# Patient Record
Sex: Female | Born: 1945 | Race: White | Hispanic: No | Smoking: Former smoker
Health system: Southern US, Community
[De-identification: ages and names within clinical notes are randomized; demographics above are authoritative.]

## PROBLEM LIST (undated history)

## (undated) DIAGNOSIS — E7800 Pure hypercholesterolemia, unspecified: Secondary | ICD-10-CM

## (undated) DIAGNOSIS — Z9889 Other specified postprocedural states: Secondary | ICD-10-CM

## (undated) DIAGNOSIS — F329 Major depressive disorder, single episode, unspecified: Secondary | ICD-10-CM

## (undated) DIAGNOSIS — E079 Disorder of thyroid, unspecified: Secondary | ICD-10-CM

## (undated) DIAGNOSIS — E039 Hypothyroidism, unspecified: Secondary | ICD-10-CM

## (undated) DIAGNOSIS — M5020 Other cervical disc displacement, unspecified cervical region: Secondary | ICD-10-CM

## (undated) DIAGNOSIS — E049 Nontoxic goiter, unspecified: Secondary | ICD-10-CM

## (undated) DIAGNOSIS — D649 Anemia, unspecified: Secondary | ICD-10-CM

## (undated) DIAGNOSIS — I10 Essential (primary) hypertension: Secondary | ICD-10-CM

## (undated) DIAGNOSIS — M810 Age-related osteoporosis without current pathological fracture: Secondary | ICD-10-CM

## (undated) DIAGNOSIS — H269 Unspecified cataract: Secondary | ICD-10-CM

## (undated) DIAGNOSIS — I509 Heart failure, unspecified: Secondary | ICD-10-CM

## (undated) DIAGNOSIS — Z87891 Personal history of nicotine dependence: Secondary | ICD-10-CM

## (undated) DIAGNOSIS — I499 Cardiac arrhythmia, unspecified: Secondary | ICD-10-CM

## (undated) DIAGNOSIS — R112 Nausea with vomiting, unspecified: Secondary | ICD-10-CM

## (undated) DIAGNOSIS — F32A Depression, unspecified: Secondary | ICD-10-CM

## (undated) DIAGNOSIS — J449 Chronic obstructive pulmonary disease, unspecified: Secondary | ICD-10-CM

## (undated) DIAGNOSIS — M1990 Unspecified osteoarthritis, unspecified site: Secondary | ICD-10-CM

## (undated) DIAGNOSIS — F419 Anxiety disorder, unspecified: Secondary | ICD-10-CM

## (undated) HISTORY — DX: Personal history of nicotine dependence: Z87.891

## (undated) HISTORY — DX: Heart failure, unspecified: I50.9

## (undated) HISTORY — DX: Cardiac arrhythmia, unspecified: I49.9

## (undated) HISTORY — PX: ABDOMINAL HYSTERECTOMY: SHX81

## (undated) HISTORY — PX: COLONOSCOPY: SHX174

## (undated) HISTORY — PX: TUBAL LIGATION: SHX77

---

## 1993-09-25 HISTORY — PX: AUGMENTATION MAMMAPLASTY: SUR837

## 2004-07-26 ENCOUNTER — Ambulatory Visit: Payer: Self-pay | Admitting: Unknown Physician Specialty

## 2005-07-27 ENCOUNTER — Ambulatory Visit: Payer: Self-pay | Admitting: Unknown Physician Specialty

## 2006-04-27 ENCOUNTER — Ambulatory Visit: Payer: Self-pay | Admitting: Gastroenterology

## 2008-03-20 ENCOUNTER — Ambulatory Visit: Payer: Self-pay | Admitting: Unknown Physician Specialty

## 2008-08-10 ENCOUNTER — Emergency Department: Payer: Self-pay | Admitting: Internal Medicine

## 2008-08-27 ENCOUNTER — Ambulatory Visit: Payer: Self-pay | Admitting: Internal Medicine

## 2013-10-23 ENCOUNTER — Ambulatory Visit: Payer: Self-pay | Admitting: Internal Medicine

## 2014-10-28 ENCOUNTER — Ambulatory Visit: Payer: Self-pay | Admitting: Family Medicine

## 2015-02-23 ENCOUNTER — Ambulatory Visit: Payer: Medicare Other

## 2015-02-23 ENCOUNTER — Ambulatory Visit
Admission: EM | Admit: 2015-02-23 | Discharge: 2015-02-23 | Disposition: A | Discharge status: Home / Self Care | Payer: Medicare Other | Attending: Family Medicine | Admitting: Family Medicine

## 2015-02-23 DIAGNOSIS — Z87891 Personal history of nicotine dependence: Secondary | ICD-10-CM | POA: Diagnosis not present

## 2015-02-23 DIAGNOSIS — E079 Disorder of thyroid, unspecified: Secondary | ICD-10-CM | POA: Diagnosis not present

## 2015-02-23 DIAGNOSIS — I10 Essential (primary) hypertension: Secondary | ICD-10-CM | POA: Diagnosis not present

## 2015-02-23 DIAGNOSIS — M7712 Lateral epicondylitis, left elbow: Secondary | ICD-10-CM

## 2015-02-23 DIAGNOSIS — M810 Age-related osteoporosis without current pathological fracture: Secondary | ICD-10-CM | POA: Insufficient documentation

## 2015-02-23 DIAGNOSIS — E780 Pure hypercholesterolemia: Secondary | ICD-10-CM | POA: Insufficient documentation

## 2015-02-23 DIAGNOSIS — Z79899 Other long term (current) drug therapy: Secondary | ICD-10-CM | POA: Diagnosis not present

## 2015-02-23 DIAGNOSIS — M25532 Pain in left wrist: Secondary | ICD-10-CM | POA: Diagnosis present

## 2015-02-23 HISTORY — DX: Disorder of thyroid, unspecified: E07.9

## 2015-02-23 HISTORY — DX: Pure hypercholesterolemia, unspecified: E78.00

## 2015-02-23 HISTORY — DX: Age-related osteoporosis without current pathological fracture: M81.0

## 2015-02-23 HISTORY — DX: Essential (primary) hypertension: I10
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
## 2015-02-23 NOTE — Discharge Instructions (Signed)
capscaicin cream Lateral Epicondylitis (Tennis Elbow) with Rehab Lateral epicondylitis involves inflammation and pain around the outer portion of the elbow. The pain is caused by inflammation of the tendons in the forearm that bring back (extend) the wrist. Lateral epicondylitis is also called tennis elbow, because it is very common in tennis players. However, it may occur in any individual who extends the wrist repetitively. If lateral epicondylitis is left untreated, it may become a chronic problem. SYMPTOMS   Pain, tenderness, and inflammation on the outer (lateral) side of the elbow.  Pain or weakness with gripping activities.  Pain that increases with wrist-twisting motions (playing tennis, using a screwdriver, opening a door or a jar).  Pain with lifting objects, including a coffee cup. CAUSES  Lateral epicondylitis is caused by inflammation of the tendons that extend the wrist. Causes of injury may include:  Repetitive stress and strain on the muscles and tendons that extend the wrist.  Sudden change in activity level or intensity.  Incorrect grip in racquet sports.  Incorrect grip size of racquet (often too large).  Incorrect hitting position or technique (usually backhand, leading with the elbow).  Using a racket that is too heavy. RISK INCREASES WITH:  Sports or occupations that require repetitive and/or strenuous forearm and wrist movements (tennis, squash, racquetball, carpentry).  Poor wrist and forearm strength and flexibility.  Failure to warm up properly before activity.  Resuming activity before healing, rehabilitation, and conditioning are complete. PREVENTION   Warm up and stretch properly before activity.  Maintain physical fitness:  Strength, flexibility, and endurance.  Cardiovascular fitness.  Wear and use properly fitted equipment.  Learn and use proper technique and have a coach correct improper technique.  Wear a tennis elbow (counterforce)  brace. PROGNOSIS  The course of this condition depends on the degree of the injury. If treated properly, acute cases (symptoms lasting less than 4 weeks) are often resolved in 2 to 6 weeks. Chronic (longer lasting cases) often resolve in 3 to 6 months but may require physical therapy. RELATED COMPLICATIONS   Frequently recurring symptoms, resulting in a chronic problem. Properly treating the problem the first time decreases frequency of recurrence.  Chronic inflammation, scarring tendon degeneration, and partial tendon tear, requiring surgery.  Delayed healing or resolution of symptoms. TREATMENT  Treatment first involves the use of ice and medicine to reduce pain and inflammation. Strengthening and stretching exercises may help reduce discomfort if performed regularly. These exercises may be performed at home if the condition is an acute injury. Chronic cases may require a referral to a physical therapist for evaluation and treatment. Your caregiver may advise a corticosteroid injection to help reduce inflammation. Rarely, surgery is needed. MEDICATION  If pain medicine is needed, nonsteroidal anti-inflammatory medicines (aspirin and ibuprofen), or other minor pain relievers (acetaminophen), are often advised.  Do not take pain medicine for 7 days before surgery.  Prescription pain relievers may be given, if your caregiver thinks they are needed. Use only as directed and only as much as you need.  Corticosteroid injections may be recommended. These injections should be reserved only for the most severe cases, because they can only be given a certain number of times. HEAT AND COLD  Cold treatment (icing) should be applied for 10 to 15 minutes every 2 to 3 hours for inflammation and pain, and immediately after activity that aggravates your symptoms. Use ice packs or an ice massage.  Heat treatment may be used before performing stretching and strengthening activities prescribed by  your  caregiver, physical therapist, or athletic trainer. Use a heat pack or a warm water soak. SEEK MEDICAL CARE IF: Symptoms get worse or do not improve in 2 weeks, despite treatment. EXERCISES  RANGE OF MOTION (ROM) AND STRETCHING EXERCISES - Epicondylitis, Lateral (Tennis Elbow) These exercises may help you when beginning to rehabilitate your injury. Your symptoms may go away with or without further involvement from your physician, physical therapist, or athletic trainer. While completing these exercises, remember:   Restoring tissue flexibility helps normal motion to return to the joints. This allows healthier, less painful movement and activity.  An effective stretch should be held for at least 30 seconds.  A stretch should never be painful. You should only feel a gentle lengthening or release in the stretched tissue. RANGE OF MOTION - Wrist Flexion, Active-Assisted  Extend your right / left elbow with your fingers pointing down.*  Gently pull the back of your hand towards you, until you feel a gentle stretch on the top of your forearm.  Hold this position for __________ seconds. Repeat __________ times. Complete this exercise __________ times per day.  *If directed by your physician, physical therapist or athletic trainer, complete this stretch with your elbow bent, rather than extended. RANGE OF MOTION - Wrist Extension, Active-Assisted  Extend your right / left elbow and turn your palm upwards.*  Gently pull your palm and fingertips back, so your wrist extends and your fingers point more toward the ground.  You should feel a gentle stretch on the inside of your forearm.  Hold this position for __________ seconds. Repeat __________ times. Complete this exercise __________ times per day. *If directed by your physician, physical therapist or athletic trainer, complete this stretch with your elbow bent, rather than extended. STRETCH - Wrist Flexion  Place the back of your right /  left hand on a tabletop, leaving your elbow slightly bent. Your fingers should point away from your body.  Gently press the back of your hand down onto the table by straightening your elbow. You should feel a stretch on the top of your forearm.  Hold this position for __________ seconds. Repeat __________ times. Complete this stretch __________ times per day.  STRETCH - Wrist Extension   Place your right / left fingertips on a tabletop, leaving your elbow slightly bent. Your fingers should point backwards.  Gently press your fingers and palm down onto the table by straightening your elbow. You should feel a stretch on the inside of your forearm.  Hold this position for __________ seconds. Repeat __________ times. Complete this stretch __________ times per day.  STRENGTHENING EXERCISES - Epicondylitis, Lateral (Tennis Elbow) These exercises may help you when beginning to rehabilitate your injury. They may resolve your symptoms with or without further involvement from your physician, physical therapist, or athletic trainer. While completing these exercises, remember:   Muscles can gain both the endurance and the strength needed for everyday activities through controlled exercises.  Complete these exercises as instructed by your physician, physical therapist or athletic trainer. Increase the resistance and repetitions only as guided.  You may experience muscle soreness or fatigue, but the pain or discomfort you are trying to eliminate should never worsen during these exercises. If this pain does get worse, stop and make sure you are following the directions exactly. If the pain is still present after adjustments, discontinue the exercise until you can discuss the trouble with your caregiver. STRENGTH - Wrist Flexors  Sit with your right / left forearm  palm-up and fully supported on a table or countertop. Your elbow should be resting below the height of your shoulder. Allow your wrist to extend  over the edge of the surface.  Loosely holding a __________ weight, or a piece of rubber exercise band or tubing, slowly curl your hand up toward your forearm.  Hold this position for __________ seconds. Slowly lower the wrist back to the starting position in a controlled manner. Repeat __________ times. Complete this exercise __________ times per day.  STRENGTH - Wrist Extensors  Sit with your right / left forearm palm-down and fully supported on a table or countertop. Your elbow should be resting below the height of your shoulder. Allow your wrist to extend over the edge of the surface.  Loosely holding a __________ weight, or a piece of rubber exercise band or tubing, slowly curl your hand up toward your forearm.  Hold this position for __________ seconds. Slowly lower the wrist back to the starting position in a controlled manner. Repeat __________ times. Complete this exercise __________ times per day.  STRENGTH - Ulnar Deviators  Stand with a ____________________ weight in your right / left hand, or sit while holding a rubber exercise band or tubing, with your healthy arm supported on a table or countertop.  Move your wrist, so that your pinkie travels toward your forearm and your thumb moves away from your forearm.  Hold this position for __________ seconds and then slowly lower the wrist back to the starting position. Repeat __________ times. Complete this exercise __________ times per day STRENGTH - Radial Deviators  Stand with a ____________________ weight in your right / left hand, or sit while holding a rubber exercise band or tubing, with your injured arm supported on a table or countertop.  Raise your hand upward in front of you or pull up on the rubber tubing.  Hold this position for __________ seconds and then slowly lower the wrist back to the starting position. Repeat __________ times. Complete this exercise __________ times per day. STRENGTH - Forearm Supinators    Sit with your right / left forearm supported on a table, keeping your elbow below shoulder height. Rest your hand over the edge, palm down.  Gently grip a hammer or a soup ladle.  Without moving your elbow, slowly turn your palm and hand upward to a "thumbs-up" position.  Hold this position for __________ seconds. Slowly return to the starting position. Repeat __________ times. Complete this exercise __________ times per day.  STRENGTH - Forearm Pronators   Sit with your right / left forearm supported on a table, keeping your elbow below shoulder height. Rest your hand over the edge, palm up.  Gently grip a hammer or a soup ladle.  Without moving your elbow, slowly turn your palm and hand upward to a "thumbs-up" position.  Hold this position for __________ seconds. Slowly return to the starting position. Repeat __________ times. Complete this exercise __________ times per day.  STRENGTH - Grip  Grasp a tennis ball, a dense sponge, or a large, rolled sock in your hand.  Squeeze as hard as you can, without increasing any pain.  Hold this position for __________ seconds. Release your grip slowly. Repeat __________ times. Complete this exercise __________ times per day.  STRENGTH - Elbow Extensors, Isometric  Stand or sit upright, on a firm surface. Place your right / left arm so that your palm faces your stomach, and it is at the height of your waist.  Place your opposite  hand on the underside of your forearm. Gently push up as your right / left arm resists. Push as hard as you can with both arms, without causing any pain or movement at your right / left elbow. Hold this stationary position for __________ seconds. Gradually release the tension in both arms. Allow your muscles to relax completely before repeating. Document Released: 09/11/2005 Document Revised: 01/26/2014 Document Reviewed: 12/24/2008 [facility] Patient Information 2015 [place], [place]. This information is not intended  to replace advice given to you by your health care provider. Make sure you discuss any questions you have with your health care provider.  Lateral Epicondylitis (Tennis Elbow) with Rehab Lateral epicondylitis involves inflammation and pain around the outer portion of the elbow. The pain is caused by inflammation of the tendons in the forearm that bring back (extend) the wrist. Lateral epicondylitis is also called tennis elbow, because it is very common in tennis players. However, it may occur in any individual who extends the wrist repetitively. If lateral epicondylitis is left untreated, it may become a chronic problem. SYMPTOMS   Pain, tenderness, and inflammation on the outer (lateral) side of the elbow.  Pain or weakness with gripping activities.  Pain that increases with wrist-twisting motions (playing tennis, using a screwdriver, opening a door or a jar).  Pain with lifting objects, including a coffee cup. CAUSES  Lateral epicondylitis is caused by inflammation of the tendons that extend the wrist. Causes of injury may include:  Repetitive stress and strain on the muscles and tendons that extend the wrist.  Sudden change in activity level or intensity.  Incorrect grip in racquet sports.  Incorrect grip size of racquet (often too large).  Incorrect hitting position or technique (usually backhand, leading with the elbow).  Using a racket that is too heavy. RISK INCREASES WITH:  Sports or occupations that require repetitive and/or strenuous forearm and wrist movements (tennis, squash, racquetball, carpentry).  Poor wrist and forearm strength and flexibility.  Failure to warm up properly before activity.  Resuming activity before healing, rehabilitation, and conditioning are complete. PREVENTION   Warm up and stretch properly before activity.  Maintain physical fitness:  Strength, flexibility, and endurance.  Cardiovascular fitness.  Wear and use properly fitted  equipment.  Learn and use proper technique and have a coach correct improper technique.  Wear a tennis elbow (counterforce) brace. PROGNOSIS  The course of this condition depends on the degree of the injury. If treated properly, acute cases (symptoms lasting less than 4 weeks) are often resolved in 2 to 6 weeks. Chronic (longer lasting cases) often resolve in 3 to 6 months but may require physical therapy. RELATED COMPLICATIONS   Frequently recurring symptoms, resulting in a chronic problem. Properly treating the problem the first time decreases frequency of recurrence.  Chronic inflammation, scarring tendon degeneration, and partial tendon tear, requiring surgery.  Delayed healing or resolution of symptoms. TREATMENT  Treatment first involves the use of ice and medicine to reduce pain and inflammation. Strengthening and stretching exercises may help reduce discomfort if performed regularly. These exercises may be performed at home if the condition is an acute injury. Chronic cases may require a referral to a physical therapist for evaluation and treatment. Your caregiver may advise a corticosteroid injection to help reduce inflammation. Rarely, surgery is needed. MEDICATION  If pain medicine is needed, nonsteroidal anti-inflammatory medicines (aspirin and ibuprofen), or other minor pain relievers (acetaminophen), are often advised.  Do not take pain medicine for 7 days before surgery.  Prescription  pain relievers may be given, if your caregiver thinks they are needed. Use only as directed and only as much as you need.  Corticosteroid injections may be recommended. These injections should be reserved only for the most severe cases, because they can only be given a certain number of times. HEAT AND COLD  Cold treatment (icing) should be applied for 10 to 15 minutes every 2 to 3 hours for inflammation and pain, and immediately after activity that aggravates your symptoms. Use ice packs or an  ice massage.  Heat treatment may be used before performing stretching and strengthening activities prescribed by your caregiver, physical therapist, or athletic trainer. Use a heat pack or a warm water soak. SEEK MEDICAL CARE IF: Symptoms get worse or do not improve in 2 weeks, despite treatment. EXERCISES  RANGE OF MOTION (ROM) AND STRETCHING EXERCISES - Epicondylitis, Lateral (Tennis Elbow) These exercises may help you when beginning to rehabilitate your injury. Your symptoms may go away with or without further involvement from your physician, physical therapist, or athletic trainer. While completing these exercises, remember:   Restoring tissue flexibility helps normal motion to return to the joints. This allows healthier, less painful movement and activity.  An effective stretch should be held for at least 30 seconds.  A stretch should never be painful. You should only feel a gentle lengthening or release in the stretched tissue. RANGE OF MOTION - Wrist Flexion, Active-Assisted  Extend your right / left elbow with your fingers pointing down.*  Gently pull the back of your hand towards you, until you feel a gentle stretch on the top of your forearm.  Hold this position for __________ seconds. Repeat __________ times. Complete this exercise __________ times per day.  *If directed by your physician, physical therapist or athletic trainer, complete this stretch with your elbow bent, rather than extended. RANGE OF MOTION - Wrist Extension, Active-Assisted  Extend your right / left elbow and turn your palm upwards.*  Gently pull your palm and fingertips back, so your wrist extends and your fingers point more toward the ground.  You should feel a gentle stretch on the inside of your forearm.  Hold this position for __________ seconds. Repeat __________ times. Complete this exercise __________ times per day. *If directed by your physician, physical therapist or athletic trainer, complete  this stretch with your elbow bent, rather than extended. STRETCH - Wrist Flexion  Place the back of your right / left hand on a tabletop, leaving your elbow slightly bent. Your fingers should point away from your body.  Gently press the back of your hand down onto the table by straightening your elbow. You should feel a stretch on the top of your forearm.  Hold this position for __________ seconds. Repeat __________ times. Complete this stretch __________ times per day.  STRETCH - Wrist Extension   Place your right / left fingertips on a tabletop, leaving your elbow slightly bent. Your fingers should point backwards.  Gently press your fingers and palm down onto the table by straightening your elbow. You should feel a stretch on the inside of your forearm.  Hold this position for __________ seconds. Repeat __________ times. Complete this stretch __________ times per day.  STRENGTHENING EXERCISES - Epicondylitis, Lateral (Tennis Elbow) These exercises may help you when beginning to rehabilitate your injury. They may resolve your symptoms with or without further involvement from your physician, physical therapist, or athletic trainer. While completing these exercises, remember:   Muscles can gain both the endurance  and the strength needed for everyday activities through controlled exercises.  Complete these exercises as instructed by your physician, physical therapist or athletic trainer. Increase the resistance and repetitions only as guided.  You may experience muscle soreness or fatigue, but the pain or discomfort you are trying to eliminate should never worsen during these exercises. If this pain does get worse, stop and make sure you are following the directions exactly. If the pain is still present after adjustments, discontinue the exercise until you can discuss the trouble with your caregiver. STRENGTH - Wrist Flexors  Sit with your right / left forearm palm-up and fully supported on  a table or countertop. Your elbow should be resting below the height of your shoulder. Allow your wrist to extend over the edge of the surface.  Loosely holding a __________ weight, or a piece of rubber exercise band or tubing, slowly curl your hand up toward your forearm.  Hold this position for __________ seconds. Slowly lower the wrist back to the starting position in a controlled manner. Repeat __________ times. Complete this exercise __________ times per day.  STRENGTH - Wrist Extensors  Sit with your right / left forearm palm-down and fully supported on a table or countertop. Your elbow should be resting below the height of your shoulder. Allow your wrist to extend over the edge of the surface.  Loosely holding a __________ weight, or a piece of rubber exercise band or tubing, slowly curl your hand up toward your forearm.  Hold this position for __________ seconds. Slowly lower the wrist back to the starting position in a controlled manner. Repeat __________ times. Complete this exercise __________ times per day.  STRENGTH - Ulnar Deviators  Stand with a ____________________ weight in your right / left hand, or sit while holding a rubber exercise band or tubing, with your healthy arm supported on a table or countertop.  Move your wrist, so that your pinkie travels toward your forearm and your thumb moves away from your forearm.  Hold this position for __________ seconds and then slowly lower the wrist back to the starting position. Repeat __________ times. Complete this exercise __________ times per day STRENGTH - Radial Deviators  Stand with a ____________________ weight in your right / left hand, or sit while holding a rubber exercise band or tubing, with your injured arm supported on a table or countertop.  Raise your hand upward in front of you or pull up on the rubber tubing.  Hold this position for __________ seconds and then slowly lower the wrist back to the starting  position. Repeat __________ times. Complete this exercise __________ times per day. STRENGTH - Forearm Supinators   Sit with your right / left forearm supported on a table, keeping your elbow below shoulder height. Rest your hand over the edge, palm down.  Gently grip a hammer or a soup ladle.  Without moving your elbow, slowly turn your palm and hand upward to a "thumbs-up" position.  Hold this position for __________ seconds. Slowly return to the starting position. Repeat __________ times. Complete this exercise __________ times per day.  STRENGTH - Forearm Pronators   Sit with your right / left forearm supported on a table, keeping your elbow below shoulder height. Rest your hand over the edge, palm up.  Gently grip a hammer or a soup ladle.  Without moving your elbow, slowly turn your palm and hand upward to a "thumbs-up" position.  Hold this position for __________ seconds. Slowly return to the starting  position. Repeat __________ times. Complete this exercise __________ times per day.  STRENGTH - Grip  Grasp a tennis ball, a dense sponge, or a large, rolled sock in your hand.  Squeeze as hard as you can, without increasing any pain.  Hold this position for __________ seconds. Release your grip slowly. Repeat __________ times. Complete this exercise __________ times per day.  STRENGTH - Elbow Extensors, Isometric  Stand or sit upright, on a firm surface. Place your right / left arm so that your palm faces your stomach, and it is at the height of your waist.  Place your opposite hand on the underside of your forearm. Gently push up as your right / left arm resists. Push as hard as you can with both arms, without causing any pain or movement at your right / left elbow. Hold this stationary position for __________ seconds. Gradually release the tension in both arms. Allow your muscles to relax completely before repeating. Document Released: 09/11/2005 Document Revised:  01/26/2014 Document Reviewed: 12/24/2008 [facility] Patient Information 2015 [place], [place]. This information is not intended to replace advice given to you by your health care provider. Make sure you discuss any questions you have with your health care provider.

## 2015-04-16 ENCOUNTER — Other Ambulatory Visit: Payer: Self-pay | Admitting: Internal Medicine

## 2015-04-16 DIAGNOSIS — Z1231 Encounter for screening mammogram for malignant neoplasm of breast: Secondary | ICD-10-CM

## 2015-04-21 ENCOUNTER — Ambulatory Visit
Admission: RE | Admit: 2015-04-21 | Discharge: 2015-04-21 | Disposition: A | Discharge status: Home / Self Care | Payer: Medicare Other | Source: Ambulatory Visit | Attending: Internal Medicine | Admitting: Internal Medicine

## 2015-04-21 ENCOUNTER — Other Ambulatory Visit: Payer: Self-pay | Admitting: Internal Medicine

## 2015-04-21 DIAGNOSIS — Z1231 Encounter for screening mammogram for malignant neoplasm of breast: Secondary | ICD-10-CM | POA: Insufficient documentation

## 2015-12-02 ENCOUNTER — Ambulatory Visit: Payer: Medicare Other | Attending: Rheumatology | Admitting: Occupational Therapy

## 2015-12-02 DIAGNOSIS — M79644 Pain in right finger(s): Secondary | ICD-10-CM

## 2015-12-02 DIAGNOSIS — M6281 Muscle weakness (generalized): Secondary | ICD-10-CM | POA: Diagnosis present

## 2015-12-02 DIAGNOSIS — M25641 Stiffness of right hand, not elsewhere classified: Secondary | ICD-10-CM | POA: Diagnosis present

## 2015-12-02 NOTE — Patient Instructions (Signed)
Pt to do contrast bath  Compression sleeves fitted on 2nd and 3rd digit - large to wear gradaully during day - check for if to tight  If good and wear at night time   Medial N glide for R hand review only 3-5 reps 2 x day   Ed on joint protection principles and Adaptive Equipment for bilateral hands - R more than L  Hand out provided

## 2015-12-02 NOTE — Therapy (Signed)
[facility] PHYSICAL AND SPORTS MEDICINE [address] [place], [place], [address] Phone: [phone]   Fax:  [phone]  Occupational Therapy Treatment  Patient Details  Name: Christina Carr MRN: [id number] Date of Birth: 10/13/1945 Referring Provider: Gavin PottersKernodle   Encounter Date: 12/02/2015      OT End of Session - 12/02/15 1902    Activity Tolerance Patient tolerated treatment well   Behavior During Therapy [facility] for tasks assessed/performed      Past Medical History  Diagnosis Date  . Hypertension   . Hypercholesteremia   . Thyroid disease   . Osteoporosis     Past Surgical History  Procedure Laterality Date  . Abdominal hysterectomy    . Augmentation mammaplasty Bilateral 1995    silicone    There were no vitals filed for this visit.  Visit Diagnosis:  Pain of finger of right hand - Plan: Ot plan of care cert/re-cert  Stiffness of finger joint of right hand - Plan: Ot plan of care cert/re-cert  Muscle weakness - Plan: Ot plan of care cert/re-cert      Subjective Assessment - 12/02/15 1849    Subjective  Had this issue in my R hand since about 2 yrs ago - that was when I moved into this house    Patient Stated Goals Pain in my R 2 fingers better that I can push on it , grip , open packages , do some gardening and use my tablet    Currently in Pain? 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    Referring Provider [employer]    Onset Date 03/26/15   Assessment Pt present with increase edema , pain at R 2nd and 3rd DIP's    Home  Environment   Lives With Alone   Prior Function   Level of Independence Independent   Vocation Retired   Leisure Work in yard a lot , garden , do everything ,  freeze and can veggies,  work on tablet,  house work    Edema   Edema R 3rd DIP 5.8 and L 5.5; 2nd digit DIP , R 6.0 adn L 5.3   Strength   Right Hand Grip (lbs) 42   Right Hand Lateral Pinch 17 lbs   Right Hand 3 Point Pinch 7 lbs   Left Hand Grip (lbs) 45   Left Hand Lateral Pinch 18 lbs   Left Hand 3 Point Pinch 10 lbs   Right Hand AROM   R Index  MCP 0-90 90 Degrees   R Index PIP 0-100 100 Degrees   R Index DIP 0-70 64 Degrees  -45   R Long  MCP 0-90 90 Degrees   R Long PIP 0-100 100 Degrees   R Long DIP 0-70 45 Degrees  0 extention    Left Hand AROM   L Index DIP 0-70 60 Degrees   L Long DIP 0-70 35 Degrees         See pt instruction for session                   OT Education -  12/02/15 1859    Education provided Yes   Education Details HEP    Person(s) Educated Patient   Methods Explanation;Demonstration;Tactile cues;Verbal cues   Comprehension Verbal cues required;Returned demonstration;Verbalized understanding          OT Short Term Goals - 12/02/15 1906    OT SHORT TERM GOAL #1   Title Pain on PRWHE improve with at least 15 ponts   Baseline Pain on PRWHE at eval 35 /50   Time 3   Period Weeks   Status New   OT SHORT TERM GOAL #2   Title Pt to be in HEP to decrease pain and increase ROM and use of R dominatn hand    Time 3   Period Weeks   Status New           OT Long Term Goals - 12/02/15 1907    OT LONG TERM GOAL #1   Title Pt to verbalize 3 AE and joint protecion /modifications she done to decrease pain and increase use of R hand    Baseline function 20/50 on PRWHE - some AE but no modifications    Time 4   Period Weeks   Status New               Plan - 12/02/15 1902    Clinical Impression Statement Pt present with increase edema , pain and arthritiic changes to R 2nd and 3rd DIP 's   - pt is R hand dominant and report had increase trouble with it since about 2 yrs ago - pt bought new house about then and worked more  in garden and yard - using larger quipment - she reprot having some CTS in R and  Dequervain history in L  hand  ( 3 shots in past year)  -  pt was ed on joint protection and AE - as well as homeprogram - neoprene  wriat and thumb wrap for L  hand , compression  sleeves for 2ndand 3rd diigits      Pt will benefit from skilled therapeutic intervention in order to improve on the following deficits (Retired) Decreased range of motion;Impaired flexibility;Pain;Impaired UE functional use;Decreased strength;Decreased knowledge of use of DME;Increased edema   Rehab Potential Fair   OT Frequency 1x / week   OT Duration 4 weeks   OT Treatment/Interventions Self-care/ADL training;Contrast Bath;Fluidtherapy;Parrafin;Therapeutic exercise;Patient/family education;Splinting;Manual Therapy;DME and/or AE instruction   Plan assess pain and using of jointprotectin and AE    OT Home Exercise Plan See pt instruciton    Consulted and Agree with Plan of Care Patient        Problem List There are no active problems to display for this patient.   Oletta Cohn OTR/l,CLT  12/02/2015, 7:12 PM  [place] [facility] PHYSICAL AND SPORTS MEDICINE [address], [place], [address] Phone: [phone]   Fax:  [phone]  Name: Christina Carr MRN: [id number] Date of Birth: [birth year]

## 2015-12-14 ENCOUNTER — Ambulatory Visit: Payer: Medicare Other | Admitting: Occupational Therapy

## 2015-12-14 DIAGNOSIS — M25641 Stiffness of right hand, not elsewhere classified: Secondary | ICD-10-CM

## 2015-12-14 DIAGNOSIS — M79644 Pain in right finger(s): Secondary | ICD-10-CM | POA: Diagnosis not present

## 2015-12-14 DIAGNOSIS — M6281 Muscle weakness (generalized): Secondary | ICD-10-CM

## 2015-12-14 NOTE — Patient Instructions (Signed)
Add tendon glides  Reviewed use of parafin bath   And cont with HEP and joint protection /AE at home

## 2015-12-14 NOTE — Therapy (Signed)
[facility] [address] [place], [place], [address] Phone: [phone]   Fax:  [phone]  Occupational Therapy Treatment  Patient Details  Name: Christina Carr MRN: [id number] Date of Birth: 02/03/1946 Referring Provider: Gavin PottersKernodle   Encounter Date: 12/14/2015      OT End of Session - 12/14/15 1047    Visit Number 2   Number of Visits 4   Date for OT Re-Evaluation 12/30/15   OT Start Time 1005   OT Stop Time 1040   OT Time Calculation (min) 35 min   Activity Tolerance Patient tolerated treatment well   Behavior During Therapy [facility] for tasks assessed/performed      Past Medical History  Diagnosis Date  . Hypertension   . Hypercholesteremia   . Thyroid disease   . Osteoporosis     Past Surgical History  Procedure Laterality Date  . Abdominal hysterectomy    . Augmentation mammaplasty Bilateral 1995    silicone    There were no vitals filed for this visit.  Visit Diagnosis:  Pain of finger of right hand  Stiffness of finger joint of right hand  Muscle weakness      Subjective Assessment - 12/14/15 1043    Subjective  pain little more in my middle finger than index- pushing down on aerosal bottle really hurts - or open packages - but now I cut it open with spring loaded scissors , and got some foam to built up handles - trying to change the way  I grip ojbects    Patient Stated Goals Pain in my R 2 fingers better that I can push on it , grip , open packages , do some gardening and use my tablet    Currently in Pain? Yes   Pain Score 2    Pain Location Finger (Comment which one)   Pain Orientation Right   Pain Descriptors / Indicators Aching                      OT Treatments/Exercises (OP) - 12/14/15 0001    Hand Exercises   Other Hand Exercises ROM measurements taken-- Tendon glides reviewed and HEP provided - pt to do only ROM - not force or squeezing    Other Hand Exercises  Med N glide reviewed -    RUE Paraffin   Number Minutes Paraffin 10 Minutes   RUE Paraffin Location Hand   [facility] - pt ed on using it at home - had decrease pain in 2nd  and 3rd was able to press down with finger tips without pain    Manual Therapy   Manual therapy comments assess edema at DIP's 2dn and 3rd -  [facility] fitted okay                 OT Education - 12/14/15 1047    Education provided Yes   Education Details HEP and joint protection    Person(s) Educated Patient   Methods Explanation;Demonstration;Tactile cues;Verbal cues;Handout   Comprehension Verbal cues required;Returned demonstration;Verbalized understanding          OT Short Term Goals - 12/02/15 1906    OT SHORT TERM GOAL #1   Title Pain on PRWHE improve with at least 15 ponts   Baseline Pain on PRWHE at eval 35 /50   Time 3   Period Weeks  Status New   OT SHORT TERM GOAL #2   Title Pt to be in HEP to decrease pain and increase ROM and use of R dominatn hand    Time 3   Period Weeks   Status New           OT Long Term Goals - 12/02/15 1907    OT LONG TERM GOAL #1   Title Pt to verbalize 3 AE and joint protecion /modifications she done to decrease pain and increase use of R hand    Baseline function 20/50 on PRWHE - some AE but no modifications    Time 4   Period Weeks   Status New               Plan - 12/14/15 1048    Clinical Impression Statement Pt showed increase extention at 2nd DIP - cont with compression sleeves on 2nd and 3rd - pt did get some at pharmacy store that is simular just rougher fabric - pt to try the large - fitted okay -  pt  2nd DIP not as tight with edema - skin looser - and  no pain after parafin this date - recommmend if pt wants to try that instead of contrast if she do not have flareup    Pt will benefit from skilled therapeutic intervention in order to improve on the  following deficits (Retired) Decreased range of motion;Impaired flexibility;Pain;Impaired UE functional use;Decreased strength;Decreased knowledge of use of DME;Increased edema   Rehab Potential Fair   OT Frequency 1x / week   OT Duration 4 weeks   OT Treatment/Interventions Self-care/ADL training;Contrast Bath;Fluidtherapy;Parrafin;Therapeutic exercise;Patient/family education;Splinting;Manual Therapy;DME and/or AE instruction   Plan assess pain , HEP and modifications    OT Home Exercise Plan See pt instruciton    Consulted and Agree with Plan of Care Patient        Problem List There are no active problems to display for this patient.   Oletta Cohn OTR/L,CLT  12/14/2015, 10:52 AM  [facility] PHYSICAL AND SPORTS MEDICINE [address], [place], [address] Phone: [phone]   Fax:  [phone]  Name: Christina Carr MRN: [id number] Date of Birth: Dec 25, 1945

## 2015-12-22 ENCOUNTER — Ambulatory Visit: Payer: Medicare Other | Admitting: Occupational Therapy

## 2015-12-22 DIAGNOSIS — M6281 Muscle weakness (generalized): Secondary | ICD-10-CM

## 2015-12-22 DIAGNOSIS — M79644 Pain in right finger(s): Secondary | ICD-10-CM | POA: Diagnosis not present

## 2015-12-22 DIAGNOSIS — M25641 Stiffness of right hand, not elsewhere classified: Secondary | ICD-10-CM

## 2015-12-22 NOTE — Therapy (Signed)
[place] PHYSICAL AND SPORTS MEDICINE [address] [place], [address] Phone: [phone]   Fax:  [phone]  Occupational Therapy Treatment  Patient Details  Name: GEAN LAURSEN MRN: [id number] Date of Birth: 1946-03-29 Referring Provider: Jefm Bryant   Encounter Date: 12/22/2015      OT End of Session - 12/22/15 1220    Visit Number 3   Number of Visits 3   Date for OT Re-Evaluation 12/22/15   OT Start Time 1148   OT Stop Time 1212   OT Time Calculation (min) 24 min   Activity Tolerance Patient tolerated treatment well   Behavior During Therapy [facility] for tasks assessed/performed      Past Medical History  Diagnosis Date  . Hypertension   . Hypercholesteremia   . Thyroid disease   . Osteoporosis     Past Surgical History  Procedure Laterality Date  . Abdominal hysterectomy    . Augmentation mammaplasty Bilateral 1779    silicone    There were no vitals filed for this visit.  Visit Diagnosis:  Pain of finger of right hand  Stiffness of finger joint of right hand  Muscle weakness      Subjective Assessment - 12/22/15 1216    Subjective  Pain is better, middle finger still some pain when pressing on the tip - I did get paraffin bath at [facility] - doing it in the evening - and adjusted how  Ilift , carry and grip objects    Patient Stated Goals Pain in my R 2 fingers better that I can push on it , grip , open packages , do some gardening and use my tablet    Currently in Pain? Yes   Pain Score 1    Pain Location Finger (Comment which one)   Pain Orientation Right   Pain Descriptors / Indicators Aching            [facility] OT Assessment - 12/22/15 0001    Strength   Right Hand Grip (lbs) 42   Right Hand Lateral Pinch 18 lbs   Right Hand 3 Point Pinch 10 lbs   Left Hand Grip (lbs) 45   Left Hand Lateral Pinch 18 lbs   Left Hand 3 Point Pinch 10 lbs   Right Hand AROM   R Index  MCP 0-90 90 Degrees   R Index  PIP 0-100 100 Degrees   R Index DIP 0-70 64 Degrees  -35   R Long  MCP 0-90 90 Degrees   R Long PIP 0-100 100 Degrees   R Long DIP 0-70 40 Degrees  0      Measurement taken and reviewed tendon glides , opposition  And reviewed and discuss joint protection and AE  Pt HEP review and PRWHE done                     OT Education - 12/22/15 1220    Education provided Yes   Education Details HEP   Person(s) Educated Patient   Methods Explanation;Demonstration;Tactile cues   Comprehension Verbalized understanding;Returned demonstration          OT Short Term Goals - 12/22/15 1202    OT SHORT TERM GOAL #1   Title Pain on PRWHE improve with at least 15 ponts   Baseline pain decrease to 20/50 on PRWHE   Status Achieved   OT SHORT TERM GOAL #2   Title Pt to be in HEP to decrease pain  and increase ROM and use of R dominatn hand    Status Achieved           OT Long Term Goals - 12/22/15 1202    OT LONG TERM GOAL #1   Title Pt to verbalize 3 AE and joint protecion /modifications she done to decrease pain and increase use of R hand    Baseline modify how use and function improve from 20 to 11   Status Achieved               Plan - 12/22/15 1221    Clinical Impression Statement Pt made great progress in pain and function of R hand - decrease pain on PRWHE from 35 to 20 /50 and function from 20 to 11/10 - prehension strenght improved - pt met all goals - and can cont with HEP for decrease pain , maintain ROM and strength - modify  tasks/ joint protection principles    Rehab Potential Fair   OT Treatment/Interventions Self-care/ADL training;Contrast Bath;Fluidtherapy;Parrafin;Therapeutic exercise;Patient/family education;Splinting;Manual Therapy;DME and/or AE instruction   Plan discharge with HEP   OT Home Exercise Plan See pt instruciton    Consulted and Agree with Plan of Care Patient        Problem List There are no active problems to display for  this patient.   Rosalyn Gess OTR/L,CLT  12/22/2015, 12:24 PM  [place] PHYSICAL AND SPORTS MEDICINE [address], [place], [address] Phone: [phone]   Fax:  [phone]  Name: CELIE DESROCHERS MRN: [id number] Date of Birth: May 07, 1946

## 2015-12-22 NOTE — Patient Instructions (Signed)
Pt to cont with HEP and paraffin  But an stop compression sleeves on fingers - only wear it flare up again

## 2015-12-29 ENCOUNTER — Other Ambulatory Visit: Payer: Self-pay | Admitting: Family Medicine

## 2015-12-29 ENCOUNTER — Encounter: Payer: Self-pay | Admitting: Family Medicine

## 2015-12-29 DIAGNOSIS — Z87891 Personal history of nicotine dependence: Secondary | ICD-10-CM

## 2015-12-29 HISTORY — DX: Personal history of nicotine dependence: Z87.891

## 2015-12-30 ENCOUNTER — Other Ambulatory Visit: Payer: Self-pay | Admitting: Internal Medicine

## 2015-12-30 DIAGNOSIS — Z87891 Personal history of nicotine dependence: Secondary | ICD-10-CM

## 2016-01-04 ENCOUNTER — Telehealth: Payer: Self-pay | Admitting: *Deleted

## 2016-01-04 NOTE — Telephone Encounter (Signed)
Notified patient that it is time to have lung cancer screening scan done. Verified patient's age, no signs of lung cancer, no illness that would prevent patient from receiving treatment for lung cancer, and smoking history (former, quit 3 years ago, 100 pack year history). Patient is expecting call from scheduling with appointment for screening scan and knows to call me with any questions. Original shared decision making visit was 10/28/14.

## 2016-01-05 ENCOUNTER — Ambulatory Visit
Admission: RE | Admit: 2016-01-05 | Discharge: 2016-01-05 | Disposition: A | Discharge status: Home / Self Care | Payer: Medicare Other | Source: Ambulatory Visit | Attending: Internal Medicine | Admitting: Internal Medicine

## 2016-01-05 DIAGNOSIS — J432 Centrilobular emphysema: Secondary | ICD-10-CM | POA: Insufficient documentation

## 2016-01-05 DIAGNOSIS — Z87891 Personal history of nicotine dependence: Secondary | ICD-10-CM | POA: Insufficient documentation

## 2016-01-05 DIAGNOSIS — I700 Atherosclerosis of aorta: Secondary | ICD-10-CM | POA: Diagnosis not present

## 2016-01-05 DIAGNOSIS — R918 Other nonspecific abnormal finding of lung field: Secondary | ICD-10-CM | POA: Diagnosis not present

## 2016-01-06 ENCOUNTER — Telehealth: Payer: Self-pay | Admitting: *Deleted

## 2016-01-06 NOTE — Telephone Encounter (Signed)
Notified patient of LDCT lung cancer screening results of Lung Rads 2 finding with recommendation for 12 month follow up imaging. Also notified of incidental finding noted below. Patient verbalizes understanding.   IMPRESSION: 1. Lung-RADS Category 2, benign appearance or behavior. Continue annual screening with low-dose chest CT without contrast in 12 months. 2. Atherosclerosis.  

## 2016-03-13 ENCOUNTER — Other Ambulatory Visit: Payer: Self-pay | Admitting: Internal Medicine

## 2016-03-13 DIAGNOSIS — Z1231 Encounter for screening mammogram for malignant neoplasm of breast: Secondary | ICD-10-CM

## 2016-04-21 ENCOUNTER — Other Ambulatory Visit: Payer: Self-pay | Admitting: Internal Medicine

## 2016-04-21 ENCOUNTER — Ambulatory Visit
Admission: RE | Admit: 2016-04-21 | Discharge: 2016-04-21 | Disposition: A | Discharge status: Home / Self Care | Payer: Medicare Other | Source: Ambulatory Visit | Attending: Internal Medicine | Admitting: Internal Medicine

## 2016-04-21 DIAGNOSIS — Z1231 Encounter for screening mammogram for malignant neoplasm of breast: Secondary | ICD-10-CM

## 2016-07-04 ENCOUNTER — Ambulatory Visit: Admit: 2016-07-04 | Payer: Medicare Other | Admitting: Gastroenterology

## 2016-07-04 SURGERY — COLONOSCOPY WITH PROPOFOL
Anesthesia: General

## 2016-12-26 ENCOUNTER — Telehealth: Payer: Self-pay | Admitting: *Deleted

## 2016-12-26 DIAGNOSIS — Z87891 Personal history of nicotine dependence: Secondary | ICD-10-CM

## 2016-12-26 NOTE — Telephone Encounter (Signed)
Notified patient that annual lung cancer screening low dose CT scan is due. Confirmed that patient is within the age range of 55-77, and asymptomatic, (no signs or symptoms of lung cancer). Patient denies illness that would prevent curative treatment for lung cancer if found. The patient is a former smoker, quit 2014, with a 100 pack year history. The shared decision making visit was done 10/28/14. Patient is agreeable for CT scan being scheduled.

## 2017-01-04 ENCOUNTER — Ambulatory Visit
Admission: RE | Admit: 2017-01-04 | Discharge: 2017-01-04 | Disposition: A | Discharge status: Home / Self Care | Payer: Medicare Other | Source: Ambulatory Visit | Attending: Oncology | Admitting: Oncology

## 2017-01-04 DIAGNOSIS — I2510 Atherosclerotic heart disease of native coronary artery without angina pectoris: Secondary | ICD-10-CM | POA: Diagnosis not present

## 2017-01-04 DIAGNOSIS — Z87891 Personal history of nicotine dependence: Secondary | ICD-10-CM | POA: Diagnosis not present

## 2017-01-04 DIAGNOSIS — J432 Centrilobular emphysema: Secondary | ICD-10-CM | POA: Diagnosis not present

## 2017-01-04 DIAGNOSIS — R918 Other nonspecific abnormal finding of lung field: Secondary | ICD-10-CM | POA: Insufficient documentation

## 2017-01-04 DIAGNOSIS — Z122 Encounter for screening for malignant neoplasm of respiratory organs: Secondary | ICD-10-CM | POA: Diagnosis not present

## 2017-01-04 DIAGNOSIS — I700 Atherosclerosis of aorta: Secondary | ICD-10-CM | POA: Diagnosis not present

## 2017-01-12 ENCOUNTER — Encounter: Payer: Self-pay | Admitting: *Deleted

## 2017-03-27 ENCOUNTER — Other Ambulatory Visit: Payer: Self-pay | Admitting: Internal Medicine

## 2017-03-27 DIAGNOSIS — Z1231 Encounter for screening mammogram for malignant neoplasm of breast: Secondary | ICD-10-CM

## 2017-04-11 DIAGNOSIS — E039 Hypothyroidism, unspecified: Secondary | ICD-10-CM | POA: Diagnosis present

## 2017-04-23 ENCOUNTER — Ambulatory Visit
Admission: RE | Admit: 2017-04-23 | Discharge: 2017-04-23 | Disposition: A | Discharge status: Home / Self Care | Payer: Medicare Other | Source: Ambulatory Visit | Attending: Internal Medicine | Admitting: Internal Medicine

## 2017-04-23 DIAGNOSIS — Z1231 Encounter for screening mammogram for malignant neoplasm of breast: Secondary | ICD-10-CM | POA: Diagnosis present

## 2017-05-08 ENCOUNTER — Ambulatory Visit: Admit: 2017-05-08 | Payer: Medicare Other | Admitting: Gastroenterology

## 2017-05-08 SURGERY — COLONOSCOPY WITH PROPOFOL
Anesthesia: General

## 2017-06-25 HISTORY — PX: EYE SURGERY: SHX253

## 2017-07-12 ENCOUNTER — Ambulatory Visit
Admission: RE | Admit: 2017-07-12 | Discharge: 2017-07-12 | Disposition: A | Discharge status: Home / Self Care | Payer: Medicare Other | Source: Ambulatory Visit | Attending: Ophthalmology | Admitting: Ophthalmology

## 2017-07-12 ENCOUNTER — Ambulatory Visit: Payer: Medicare Other | Admitting: Anesthesiology

## 2017-07-12 ENCOUNTER — Encounter
Admission: RE | Disposition: A | Discharge status: Home / Self Care | Payer: Self-pay | Source: Ambulatory Visit | Attending: Ophthalmology

## 2017-07-12 DIAGNOSIS — H2512 Age-related nuclear cataract, left eye: Secondary | ICD-10-CM | POA: Insufficient documentation

## 2017-07-12 DIAGNOSIS — E039 Hypothyroidism, unspecified: Secondary | ICD-10-CM | POA: Diagnosis not present

## 2017-07-12 DIAGNOSIS — Z87891 Personal history of nicotine dependence: Secondary | ICD-10-CM | POA: Insufficient documentation

## 2017-07-12 DIAGNOSIS — I10 Essential (primary) hypertension: Secondary | ICD-10-CM | POA: Diagnosis not present

## 2017-07-12 DIAGNOSIS — H52222 Regular astigmatism, left eye: Secondary | ICD-10-CM | POA: Insufficient documentation

## 2017-07-12 DIAGNOSIS — F329 Major depressive disorder, single episode, unspecified: Secondary | ICD-10-CM | POA: Diagnosis not present

## 2017-07-12 DIAGNOSIS — E7800 Pure hypercholesterolemia, unspecified: Secondary | ICD-10-CM | POA: Diagnosis not present

## 2017-07-12 DIAGNOSIS — Z79899 Other long term (current) drug therapy: Secondary | ICD-10-CM | POA: Insufficient documentation

## 2017-07-12 HISTORY — DX: Other cervical disc displacement, unspecified cervical region: M50.20

## 2017-07-12 HISTORY — DX: Hypothyroidism, unspecified: E03.9

## 2017-07-12 HISTORY — DX: Other specified postprocedural states: Z98.890

## 2017-07-12 HISTORY — DX: Other specified postprocedural states: R11.2

## 2017-07-12 HISTORY — DX: Chronic obstructive pulmonary disease, unspecified: J44.9

## 2017-07-12 HISTORY — DX: Depression, unspecified: F32.A

## 2017-07-12 HISTORY — PX: CATARACT EXTRACTION W/PHACO: SHX586

## 2017-07-12 HISTORY — DX: Major depressive disorder, single episode, unspecified: F32.9

## 2017-07-12 SURGERY — PHACOEMULSIFICATION, CATARACT, WITH IOL INSERTION
Anesthesia: Monitor Anesthesia Care | Site: Eye | Laterality: Left

## 2017-07-12 MED ORDER — LIDOCAINE HCL (PF) 4 % IJ SOLN
INTRAOCULAR | Status: DC | PRN
  Administered 2017-07-12: 4 mL via OPHTHALMIC

## 2017-07-12 MED ORDER — EPINEPHRINE PF 1 MG/ML IJ SOLN
INTRAMUSCULAR | Status: AC
  Filled 2017-07-12: qty 1

## 2017-07-12 MED ORDER — ARMC OPHTHALMIC DILATING DROPS
OPHTHALMIC | Status: AC
  Administered 2017-07-12: 1 via OPHTHALMIC
  Filled 2017-07-12: qty 0.4

## 2017-07-12 MED ORDER — FENTANYL CITRATE (PF) 100 MCG/2ML IJ SOLN
INTRAMUSCULAR | Status: AC
  Filled 2017-07-12: qty 2

## 2017-07-12 MED ORDER — POVIDONE-IODINE 5 % OP SOLN
OPHTHALMIC | Status: AC
  Filled 2017-07-12: qty 30

## 2017-07-12 MED ORDER — NA CHONDROIT SULF-NA HYALURON 40-17 MG/ML IO SOLN
INTRAOCULAR | Status: AC
  Filled 2017-07-12: qty 1

## 2017-07-12 MED ORDER — LIDOCAINE HCL (PF) 4 % IJ SOLN
INTRAMUSCULAR | Status: AC
  Filled 2017-07-12: qty 5

## 2017-07-12 MED ORDER — SODIUM CHLORIDE 0.9 % IV SOLN
INTRAVENOUS | Status: DC
  Administered 2017-07-12: 09:00:00 via INTRAVENOUS

## 2017-07-12 MED ORDER — NA CHONDROIT SULF-NA HYALURON 40-30 MG/ML IO SOLN
INTRAOCULAR | Status: DC | PRN
  Administered 2017-07-12: 1 mL via INTRAOCULAR

## 2017-07-12 MED ORDER — FENTANYL CITRATE (PF) 100 MCG/2ML IJ SOLN
INTRAMUSCULAR | Status: DC | PRN
  Administered 2017-07-12: 50 ug via INTRAVENOUS
  Administered 2017-07-12 (×2): 25 ug via INTRAVENOUS

## 2017-07-12 MED ORDER — ONDANSETRON HCL 4 MG/2ML IJ SOLN
INTRAMUSCULAR | Status: DC | PRN
  Administered 2017-07-12: 4 mg via INTRAVENOUS

## 2017-07-12 MED ORDER — ARMC OPHTHALMIC DILATING DROPS
1.0000 "application " | OPHTHALMIC | Status: DC | PRN
  Administered 2017-07-12 (×3): 1 via OPHTHALMIC

## 2017-07-12 MED ORDER — ONDANSETRON HCL 4 MG/2ML IJ SOLN
INTRAMUSCULAR | Status: AC
  Filled 2017-07-12: qty 2

## 2017-07-12 MED ORDER — POVIDONE-IODINE 5 % OP SOLN
OPHTHALMIC | Status: DC | PRN
  Administered 2017-07-12: 1 via OPHTHALMIC

## 2017-07-12 MED ORDER — MIDAZOLAM HCL 2 MG/2ML IJ SOLN
INTRAMUSCULAR | Status: DC | PRN
  Administered 2017-07-12: 0.5 mg via INTRAVENOUS
  Administered 2017-07-12: 1 mg via INTRAVENOUS
  Administered 2017-07-12: 0.5 mg via INTRAVENOUS

## 2017-07-12 MED ORDER — MOXIFLOXACIN HCL 0.5 % OP SOLN
1.0000 [drp] | OPHTHALMIC | Status: AC | PRN
  Administered 2017-07-12: 0.2 mL via OPHTHALMIC
  Filled 2017-07-12: qty 3

## 2017-07-12 MED ORDER — MOXIFLOXACIN HCL 0.5 % OP SOLN
OPHTHALMIC | Status: AC
  Filled 2017-07-12: qty 3

## 2017-07-12 MED ORDER — BSS IO SOLN
INTRAOCULAR | Status: DC | PRN
  Administered 2017-07-12: 200 mL via INTRAOCULAR

## 2017-07-12 MED ORDER — MIDAZOLAM HCL 2 MG/2ML IJ SOLN
INTRAMUSCULAR | Status: AC
  Filled 2017-07-12: qty 2

## 2017-07-12 SURGICAL SUPPLY — 18 items
DISSECTOR HYDRO NUCLEUS 50X22 (MISCELLANEOUS) ×3 IMPLANT
GLOVE BIO SURGEON STRL SZ8 (GLOVE) ×3 IMPLANT
GLOVE BIOGEL M 6.5 STRL (GLOVE) ×3 IMPLANT
GLOVE SURG LX 7.5 STRW (GLOVE) ×2
GLOVE SURG LX STRL 7.5 STRW (GLOVE) ×1 IMPLANT
GOWN STRL REUS W/ TWL LRG LVL3 (GOWN DISPOSABLE) ×2 IMPLANT
GOWN STRL REUS W/TWL LRG LVL3 (GOWN DISPOSABLE) ×4
LABEL CATARACT MEDS ST (LABEL) ×3 IMPLANT
LENS IOL ACRSF IQ TRC 4 23.0 ×1 IMPLANT
LENS IOL ACRYSOF IQ TORIC 23.0 ×2 IMPLANT
LENS IOL IQ TORIC 4 23.0 ×1 IMPLANT
PACK CATARACT (MISCELLANEOUS) ×3 IMPLANT
PACK CATARACT KING (MISCELLANEOUS) ×3 IMPLANT
PACK EYE AFTER SURG (MISCELLANEOUS) ×3 IMPLANT
SOL BSS BAG (MISCELLANEOUS) ×3
SOLUTION BSS BAG (MISCELLANEOUS) ×1 IMPLANT
WATER STERILE IRR 250ML POUR (IV SOLUTION) ×3 IMPLANT
WIPE NON LINTING 3.25X3.25 (MISCELLANEOUS) ×3 IMPLANT

## 2017-07-12 NOTE — Anesthesia Post-op Follow-up Note (Signed)
Anesthesia QCDR form completed.        

## 2017-07-12 NOTE — H&P (Signed)
The History and Physical notes are on paper, have been signed, and are to be scanned.   I have examined the patient and there are no changes to the H&P.   Willey BladeBradley Messiah Ahr 07/12/2017 9:44 AM

## 2017-07-12 NOTE — Anesthesia Preprocedure Evaluation (Signed)
Anesthesia Evaluation  Patient identified by MRN, date of birth, ID band Patient awake    Reviewed: Allergy & Precautions, NPO status , Patient's Chart, lab work & pertinent test results  History of Anesthesia Complications (+) PONV and history of anesthetic complications  Airway Mallampati: II  TM Distance: >3 FB Neck ROM: Full    Dental no notable dental hx.    Pulmonary COPD, former smoker,    breath sounds clear to auscultation- rhonchi (-) wheezing      Cardiovascular hypertension, Pt. on medications (-) CAD, (-) Past MI and (-) Cardiac Stents  Rhythm:Regular Rate:Normal - Systolic murmurs and - Diastolic murmurs NM myocardial perfusion scan 05/25/17: no evidence of ischemia  Echo 05/23/17: NORMAL LEFT VENTRICULAR SYSTOLIC FUNCTION WITH AN ESTIMATED EF = 55 % NORMAL RIGHT VENTRICULAR SYSTOLIC FUNCTION TRACE TRICUSPID AND MITRAL VALVE INSUFFICIENCY NO VALVULAR STENOSIS   Neuro/Psych PSYCHIATRIC DISORDERS Depression negative neurological ROS     GI/Hepatic negative GI ROS, Neg liver ROS,   Endo/Other  neg diabetesHypothyroidism   Renal/GU negative Renal ROS     Musculoskeletal negative musculoskeletal ROS (+)   Abdominal (+) - obese,   Peds  Hematology negative hematology ROS (+)   Anesthesia Other Findings Past Medical History: No date: Cervical disc herniation No date: COPD (chronic obstructive pulmonary disease) (HCC)     Comment:  mild emphysema No date: Depression No date: Hypercholesteremia No date: Hypertension     Comment:  8/18 Stress and Echo in 04/2017 WNL No date: Hypothyroidism No date: Osteoporosis 12/29/2015: Personal history of tobacco use, presenting hazards to  health No date: PONV (postoperative nausea and vomiting)     Comment:  only with hysterectomy No date: Thyroid disease   Reproductive/Obstetrics                             Anesthesia  Physical Anesthesia Plan  ASA: III  Anesthesia Plan: MAC   Post-op Pain Management:    Induction: Intravenous  PONV Risk Score and Plan: 3 and Midazolam  Airway Management Planned: Natural Airway  Additional Equipment:   Intra-op Plan:   Post-operative Plan:   Informed Consent: I have reviewed the patients History and Physical, chart, labs and discussed the procedure including the risks, benefits and alternatives for the proposed anesthesia with the patient or authorized representative who has indicated his/her understanding and acceptance.     Plan Discussed with: CRNA and Anesthesiologist  Anesthesia Plan Comments:         Anesthesia Quick Evaluation

## 2017-07-12 NOTE — Transfer of Care (Signed)
Immediate Anesthesia Transfer of Care Note  Patient: Christina Carr  Procedure(s) Performed: CATARACT EXTRACTION PHACO AND INTRAOCULAR LENS PLACEMENT (IOC)-LEFT (Left Eye)  Patient Location: PACU  Anesthesia Type:MAC  Level of Consciousness: awake  Airway & Oxygen Therapy: Patient Spontanous Breathing  Post-op Assessment: Report given to RN, Post -op Vital signs reviewed and stable and Patient moving all extremities X 4  Post vital signs: Reviewed and stable  Last Vitals:  Vitals:   07/12/17 0821  BP: (!) 167/82  Resp: 18  Temp: (!) 35.5 C  SpO2: (!) 58%    Last Pain:  Vitals:   07/12/17 0821  TempSrc: Oral         Complications: No apparent anesthesia complications

## 2017-07-12 NOTE — Discharge Instructions (Signed)
Eye Surgery Discharge Instructions  Expect mild scratchy sensation or mild soreness. DO NOT RUB YOUR EYE!  The day of surgery:  Minimal physical activity, but bed rest is not required  No reading, computer work, or close hand work  No bending, lifting, or straining.  May watch TV  For 24 hours:  No driving, legal decisions, or alcoholic beverages  Safety precautions  Eat anything you prefer: It is better to start with liquids, then soup then solid foods.  _____ Eye patch should be worn until postoperative exam tomorrow.  ____ Solar shield eyeglasses should be worn for comfort in the sunlight/patch while sleeping  Resume all regular medications including aspirin or Coumadin if these were discontinued prior to surgery. You may shower, bathe, shave, or wash your hair. Tylenol may be taken for mild discomfort.  Call your doctor if you experience significant pain, nausea, or vomiting, fever > 101 or other signs of infection. [phone] or [phone] Specific instructions:  Follow-up Information    [place] CraneKing, Bradley Mark, MD Follow up.   Specialty:  Ophthalmology [facility]  [facility] Contact information: [address] [place] [address] [phone]

## 2017-07-12 NOTE — Anesthesia Postprocedure Evaluation (Signed)
Anesthesia Post Note  Patient: Christina Carr  Procedure(s) Performed: CATARACT EXTRACTION PHACO AND INTRAOCULAR LENS PLACEMENT (IOC)-LEFT (Left Eye)  Patient location during evaluation: PACU Anesthesia Type: MAC Level of consciousness: awake and alert and oriented Pain management: pain level controlled Vital Signs Assessment: post-procedure vital signs reviewed and stable Respiratory status: spontaneous breathing, nonlabored ventilation and respiratory function stable Cardiovascular status: blood pressure returned to baseline and stable Postop Assessment: no signs of nausea or vomiting Anesthetic complications: no     Last Vitals:  Vitals:   07/12/17 1024 07/12/17 1029  BP: 106/61 123/61  Pulse: (!) 57 61  Resp: 15   Temp: (!) 36.2 C   SpO2: 95% 96%    Last Pain:  Vitals:   07/12/17 0821  TempSrc: Oral                 [name]

## 2017-07-12 NOTE — Op Note (Signed)
OPERATIVE NOTE  Christina SanerCarolyn R Carr [id number] 07/12/2017   PREOPERATIVE DIAGNOSIS:   1.  Nuclear sclerotic cataract left eye.  H25.12 2.  Regular astigmatism.   POSTOPERATIVE DIAGNOSIS:     1.  Nuclear sclerotic cataract left eye.   2.  Regular astigmatism.   PROCEDURE:  Phacoemusification with posterior chamber intraocular lens placement of the left eye   LENS:   Implant Name Type Inv. Item Serial No. Manufacturer Lot No. LRB No. Used  LENS IOL TORIC 23.0 - [id number]   LENS IOL TORIC 23.0 [id number] 069 ALCON   Left 1       [id number] +23.0 @174  degrees   ULTRASOUND TIME: 0 minutes 26.7 seconds.  CDE 2.14   SURGEON:  Willey BladeBradley Semiah Konczal, MD, MPH   ANESTHESIA:  Topical with tetracaine drops augmented with 1% preservative-free intracameral lidocaine.  ESTIMATED BLOOD LOSS: <1 mL   COMPLICATIONS:  None.   DESCRIPTION OF PROCEDURE:  The patient was identified in the holding room and transported to the operating room and placed in the supine position under the operating microscope.  The left eye was identified as the operative eye and it was prepped and draped in the usual sterile ophthalmic fashion.  The verion system was registered.   A 1.0 millimeter clear-corneal paracentesis was made at the 5:00 position. 0.5 ml of preservative-free 1% lidocaine with epinephrine was injected into the anterior chamber.  The anterior chamber was filled with Discovisc viscoelastic.  A 2.4 millimeter keratome was used to make a near-clear corneal incision at the 2:00 position.  A curvilinear capsulorrhexis was made with a cystotome and capsulorrhexis forceps.  Balanced salt solution was used to hydrodissect and hydrodelineate the nucleus.   Phacoemulsification was then used in stop and chop fashion to remove the lens nucleus and epinucleus.  The remaining cortex was then removed using the irrigation and aspiration handpiece. Discovisc was then placed into the capsular bag to distend it for lens placement.  A  lens was then injected into the capsular bag and rotated to 174 degrees.  The remaining viscoelastic was aspirated and the lens was in excellent position--well centered and rotated to alignment with the Verion guided system.   Wounds were hydrated with balanced salt solution.  The anterior chamber was inflated to a physiologic pressure with balanced salt solution.  Intracameral vigamox 0.1 mL undiltued was injected into the eye and a drop placed onto the ocular surface.  No wound leaks were noted.  The patient was taken to the recovery room in stable condition without complications of anesthesia or surgery  Willey BladeBradley Johnryan Sao 07/12/2017, 10:24 AM

## 2017-08-01 ENCOUNTER — Encounter: Payer: Self-pay | Admitting: *Deleted

## 2017-08-02 ENCOUNTER — Ambulatory Visit: Payer: Medicare Other | Admitting: Anesthesiology

## 2017-08-02 ENCOUNTER — Encounter: Payer: Self-pay | Admitting: Emergency Medicine

## 2017-08-02 ENCOUNTER — Ambulatory Visit
Admission: RE | Admit: 2017-08-02 | Discharge: 2017-08-02 | Disposition: A | Discharge status: Home / Self Care | Payer: Medicare Other | Source: Ambulatory Visit | Attending: Ophthalmology | Admitting: Ophthalmology

## 2017-08-02 ENCOUNTER — Encounter
Admission: RE | Disposition: A | Discharge status: Home / Self Care | Payer: Self-pay | Source: Ambulatory Visit | Attending: Ophthalmology

## 2017-08-02 DIAGNOSIS — E7800 Pure hypercholesterolemia, unspecified: Secondary | ICD-10-CM | POA: Diagnosis not present

## 2017-08-02 DIAGNOSIS — H2511 Age-related nuclear cataract, right eye: Secondary | ICD-10-CM | POA: Insufficient documentation

## 2017-08-02 DIAGNOSIS — E039 Hypothyroidism, unspecified: Secondary | ICD-10-CM | POA: Diagnosis not present

## 2017-08-02 DIAGNOSIS — H52201 Unspecified astigmatism, right eye: Secondary | ICD-10-CM | POA: Insufficient documentation

## 2017-08-02 DIAGNOSIS — I340 Nonrheumatic mitral (valve) insufficiency: Secondary | ICD-10-CM | POA: Diagnosis not present

## 2017-08-02 DIAGNOSIS — Z79899 Other long term (current) drug therapy: Secondary | ICD-10-CM | POA: Insufficient documentation

## 2017-08-02 DIAGNOSIS — I10 Essential (primary) hypertension: Secondary | ICD-10-CM | POA: Insufficient documentation

## 2017-08-02 DIAGNOSIS — Z888 Allergy status to other drugs, medicaments and biological substances status: Secondary | ICD-10-CM | POA: Insufficient documentation

## 2017-08-02 DIAGNOSIS — J439 Emphysema, unspecified: Secondary | ICD-10-CM | POA: Diagnosis not present

## 2017-08-02 DIAGNOSIS — M810 Age-related osteoporosis without current pathological fracture: Secondary | ICD-10-CM | POA: Insufficient documentation

## 2017-08-02 DIAGNOSIS — M1990 Unspecified osteoarthritis, unspecified site: Secondary | ICD-10-CM | POA: Insufficient documentation

## 2017-08-02 DIAGNOSIS — F329 Major depressive disorder, single episode, unspecified: Secondary | ICD-10-CM | POA: Diagnosis not present

## 2017-08-02 DIAGNOSIS — Z87891 Personal history of nicotine dependence: Secondary | ICD-10-CM | POA: Insufficient documentation

## 2017-08-02 DIAGNOSIS — Z90710 Acquired absence of both cervix and uterus: Secondary | ICD-10-CM | POA: Insufficient documentation

## 2017-08-02 HISTORY — DX: Unspecified osteoarthritis, unspecified site: M19.90

## 2017-08-02 HISTORY — PX: CATARACT EXTRACTION W/PHACO: SHX586

## 2017-08-02 SURGERY — PHACOEMULSIFICATION, CATARACT, WITH IOL INSERTION
Anesthesia: Monitor Anesthesia Care | Site: Eye | Laterality: Right | Wound class: Clean

## 2017-08-02 MED ORDER — FENTANYL CITRATE (PF) 100 MCG/2ML IJ SOLN
INTRAMUSCULAR | Status: DC | PRN
  Administered 2017-08-02 (×2): 25 ug via INTRAVENOUS

## 2017-08-02 MED ORDER — SODIUM HYALURONATE 23 MG/ML IO SOLN
INTRAOCULAR | Status: DC | PRN
  Administered 2017-08-02: 0.6 mL via INTRAOCULAR

## 2017-08-02 MED ORDER — FENTANYL CITRATE (PF) 100 MCG/2ML IJ SOLN
INTRAMUSCULAR | Status: AC
  Filled 2017-08-02: qty 2

## 2017-08-02 MED ORDER — MIDAZOLAM HCL 2 MG/2ML IJ SOLN
INTRAMUSCULAR | Status: DC | PRN
  Administered 2017-08-02: 2 mg via INTRAVENOUS

## 2017-08-02 MED ORDER — SODIUM HYALURONATE 10 MG/ML IO SOLN
INTRAOCULAR | Status: DC | PRN
  Administered 2017-08-02: 0.55 mL via INTRAOCULAR

## 2017-08-02 MED ORDER — POVIDONE-IODINE 5 % OP SOLN
OPHTHALMIC | Status: DC | PRN
  Administered 2017-08-02: 1 via OPHTHALMIC

## 2017-08-02 MED ORDER — MOXIFLOXACIN HCL 0.5 % OP SOLN: 1.0000 [drp] | Freq: Once | OPHTHALMIC | Status: DC

## 2017-08-02 MED ORDER — SODIUM CHLORIDE 0.9 % IV SOLN
INTRAVENOUS | Status: DC
  Administered 2017-08-02: 08:00:00 via INTRAVENOUS

## 2017-08-02 MED ORDER — EPINEPHRINE PF 1 MG/ML IJ SOLN
INTRAOCULAR | Status: DC | PRN
  Administered 2017-08-02: 09:00:00 via OPHTHALMIC

## 2017-08-02 MED ORDER — MOXIFLOXACIN HCL 0.5 % OP SOLN
OPHTHALMIC | Status: DC | PRN
  Administered 2017-08-02: 0.2 mL via OPHTHALMIC

## 2017-08-02 MED ORDER — ARMC OPHTHALMIC DILATING DROPS
OPHTHALMIC | Status: AC
  Administered 2017-08-02: 1 via OPHTHALMIC
  Filled 2017-08-02: qty 0.4

## 2017-08-02 MED ORDER — MIDAZOLAM HCL 2 MG/2ML IJ SOLN
INTRAMUSCULAR | Status: AC
  Filled 2017-08-02: qty 2

## 2017-08-02 MED ORDER — ARMC OPHTHALMIC DILATING DROPS
1.0000 "application " | OPHTHALMIC | Status: AC
  Administered 2017-08-02 (×3): 1 via OPHTHALMIC

## 2017-08-02 MED ORDER — CARBACHOL 0.01 % IO SOLN
INTRAOCULAR | Status: DC | PRN
  Administered 2017-08-02: 0.5 mL via INTRAOCULAR

## 2017-08-02 MED ORDER — MOXIFLOXACIN HCL 0.5 % OP SOLN
OPHTHALMIC | Status: AC
  Filled 2017-08-02: qty 3

## 2017-08-02 MED ORDER — LIDOCAINE HCL (PF) 4 % IJ SOLN
INTRAMUSCULAR | Status: DC | PRN
  Administered 2017-08-02: 4 mL via OPHTHALMIC

## 2017-08-02 SURGICAL SUPPLY — 18 items
DISSECTOR HYDRO NUCLEUS 50X22 (MISCELLANEOUS) ×3 IMPLANT
GLOVE BIO SURGEON STRL SZ8 (GLOVE) ×3 IMPLANT
GLOVE BIOGEL M 6.5 STRL (GLOVE) ×3 IMPLANT
GLOVE SURG LX 7.5 STRW (GLOVE) ×2
GLOVE SURG LX STRL 7.5 STRW (GLOVE) ×1 IMPLANT
GOWN STRL REUS W/ TWL LRG LVL3 (GOWN DISPOSABLE) ×2 IMPLANT
GOWN STRL REUS W/TWL LRG LVL3 (GOWN DISPOSABLE) ×4
LABEL CATARACT MEDS ST (LABEL) ×3 IMPLANT
LENS IOL ACRSF IQ TRC 4 24.0 ×1 IMPLANT
LENS IOL ACRYSOF IQ TORIC 24.0 ×2 IMPLANT
LENS IOL IQ TORIC 4 24.0 ×1 IMPLANT
PACK CATARACT (MISCELLANEOUS) ×3 IMPLANT
PACK CATARACT KING (MISCELLANEOUS) ×3 IMPLANT
PACK EYE AFTER SURG (MISCELLANEOUS) ×3 IMPLANT
SOL BSS BAG (MISCELLANEOUS) ×3
SOLUTION BSS BAG (MISCELLANEOUS) ×1 IMPLANT
WATER STERILE IRR 250ML POUR (IV SOLUTION) ×3 IMPLANT
WIPE NON LINTING 3.25X3.25 (MISCELLANEOUS) ×3 IMPLANT

## 2017-08-02 NOTE — Anesthesia Procedure Notes (Signed)
Procedure Name: MAC Date/Time: 08/02/2017 8:51 AM Performed by: Darlyne Russian, CRNA Pre-anesthesia Checklist: Patient identified, Emergency Drugs available, Suction available, Patient being monitored and Timeout performed Patient Re-evaluated:Patient Re-evaluated prior to induction Oxygen Delivery Method: Nasal cannula Placement Confirmation: positive ETCO2

## 2017-08-02 NOTE — Anesthesia Preprocedure Evaluation (Signed)
Anesthesia Evaluation  Patient identified by MRN, date of birth, ID band Patient awake    Reviewed: Allergy & Precautions, NPO status , Patient's Chart, lab work & pertinent test results  History of Anesthesia Complications (+) PONV and history of anesthetic complications  Airway Mallampati: II  TM Distance: >3 FB Neck ROM: Full    Dental no notable dental hx.    Pulmonary COPD, former smoker,    breath sounds clear to auscultation- rhonchi (-) wheezing      Cardiovascular hypertension, Pt. on medications (-) CAD, (-) Past MI and (-) Cardiac Stents  Rhythm:Regular Rate:Normal - Systolic murmurs and - Diastolic murmurs NM myocardial perfusion scan 05/25/17: no evidence of ischemia  Echo 05/23/17: NORMAL LEFT VENTRICULAR SYSTOLIC FUNCTION WITH AN ESTIMATED EF = 55 % NORMAL RIGHT VENTRICULAR SYSTOLIC FUNCTION TRACE TRICUSPID AND MITRAL VALVE INSUFFICIENCY NO VALVULAR STENOSIS   Neuro/Psych PSYCHIATRIC DISORDERS Depression negative neurological ROS     GI/Hepatic negative GI ROS, Neg liver ROS,   Endo/Other  neg diabetesHypothyroidism   Renal/GU negative Renal ROS     Musculoskeletal  (+) Arthritis ,   Abdominal (+) - obese,   Peds  Hematology negative hematology ROS (+)   Anesthesia Other Findings Past Medical History: No date: Cervical disc herniation No date: COPD (chronic obstructive pulmonary disease) (HCC)     Comment:  mild emphysema No date: Depression No date: Hypercholesteremia No date: Hypertension     Comment:  8/18 Stress and Echo in 04/2017 WNL No date: Hypothyroidism No date: Osteoporosis 12/29/2015: Personal history of tobacco use, presenting hazards to  health No date: PONV (postoperative nausea and vomiting)     Comment:  only with hysterectomy No date: Thyroid disease   Reproductive/Obstetrics                             Anesthesia Physical  Anesthesia Plan  ASA:  III  Anesthesia Plan: MAC   Post-op Pain Management:    Induction: Intravenous  PONV Risk Score and Plan: 3 and Midazolam  Airway Management Planned: Natural Airway  Additional Equipment:   Intra-op Plan:   Post-operative Plan:   Informed Consent: I have reviewed the patients History and Physical, chart, labs and discussed the procedure including the risks, benefits and alternatives for the proposed anesthesia with the patient or authorized representative who has indicated his/her understanding and acceptance.     Plan Discussed with: CRNA and Anesthesiologist  Anesthesia Plan Comments:         Anesthesia Quick Evaluation

## 2017-08-02 NOTE — Transfer of Care (Signed)
Immediate Anesthesia Transfer of Care Note  Patient: Christina Carr  Procedure(s) Performed: CATARACT EXTRACTION PHACO AND INTRAOCULAR LENS PLACEMENT (IOC)-RIGHT PRE DIABETIC (Right Eye)  Patient Location: PACU  Anesthesia Type:MAC  Level of Consciousness: awake, alert  and oriented  Airway & Oxygen Therapy: Patient Spontanous Breathing  Post-op Assessment: Report given to RN and Post -op Vital signs reviewed and stable  Post vital signs: Reviewed and stable  Last Vitals:  Vitals:   08/02/17 0737 08/02/17 0918  BP: 116/69 123/65  Pulse: 63 64  Resp: 15 16  Temp: 36.7 C   SpO2: 99% 97%    Last Pain:  Vitals:   08/02/17 0918  TempSrc: Oral         Complications: No apparent anesthesia complications

## 2017-08-02 NOTE — Anesthesia Postprocedure Evaluation (Signed)
Anesthesia Post Note  Patient: Christina Carr  Procedure(s) Performed: CATARACT EXTRACTION PHACO AND INTRAOCULAR LENS PLACEMENT (IOC)-RIGHT PRE DIABETIC (Right Eye)  Patient location during evaluation: PACU Anesthesia Type: MAC Level of consciousness: awake and alert and oriented Pain management: pain level controlled Vital Signs Assessment: post-procedure vital signs reviewed and stable Respiratory status: spontaneous breathing, nonlabored ventilation and respiratory function stable Cardiovascular status: blood pressure returned to baseline and stable Postop Assessment: no signs of nausea or vomiting Anesthetic complications: no     Last Vitals:  Vitals:   08/02/17 0737 08/02/17 0918  BP: 116/69 123/65  Pulse: 63 64  Resp: 15 16  Temp: 36.7 C   SpO2: 99% 97%    Last Pain:  Vitals:   08/02/17 0918  TempSrc: Oral                 Mylea Roarty

## 2017-08-02 NOTE — Discharge Instructions (Signed)
Eye Surgery Discharge Instructions  Expect mild scratchy sensation or mild soreness. DO NOT RUB YOUR EYE!  The day of surgery:  Minimal physical activity, but bed rest is not required  No reading, computer work, or close hand work  No bending, lifting, or straining.  May watch TV  For 24 hours:  No driving, legal decisions, or alcoholic beverages  Safety precautions  Eat anything you prefer: It is better to start with liquids, then soup then solid foods.  _____ Eye patch should be worn until postoperative exam tomorrow.  ____ Solar shield eyeglasses should be worn for comfort in the sunlight/patch while sleeping  Resume all regular medications including aspirin or Coumadin if these were discontinued prior to surgery. You may shower, bathe, shave, or wash your hair. Tylenol may be taken for mild discomfort.  Call your doctor if you experience significant pain, nausea, or vomiting, fever > 101 or other signs of infection. [phone] or [phone] Specific instructions:  Follow-up Information    [place] CraneKing, Bradley Mark, MD Follow up.   Specialty:  Ophthalmology Why:  November 9 at 9:10am Contact information: [address] [place] [address] [phone]

## 2017-08-02 NOTE — Op Note (Addendum)
OPERATIVE NOTE  Christina SanerCarolyn R Knaak [id number] 08/02/2017   PREOPERATIVE DIAGNOSIS:   1.  Nuclear sclerotic cataract right eye.  H25.11 2.  Corneal astigmatism.   POSTOPERATIVE DIAGNOSIS:     1.  Nuclear sclerotic cataract right eye.  H25.11 2.  Corneal astigmatism.    PROCEDURE:  Phacoemusification with posterior chamber intraocular lens placement of the right eye (toric lens)  LENS:   Implant Name Type Inv. Item Serial No. Manufacturer Lot No. LRB No. Used  LENS IOL TORIC 24.0 - [id number]  LENS IOL TORIC 24.0 [id number] ALCON  Right 1       [id number] 24.0 @003    ULTRASOUND TIME: 0 minutes 19.9 seconds.  CDE 2.55   SURGEON:  Willey BladeBradley King, MD, MPH  ANESTHESIOLOGIST: Anesthesiologist: Alver FisherPenwarden, Amy, MD CRNA: Marlana SalvageJessup, Sandra, CRNA   ANESTHESIA:  Topical with tetracaine drops augmented with 1% preservative-free intracameral lidocaine.  ESTIMATED BLOOD LOSS: less than 1 mL.   COMPLICATIONS:  None.   DESCRIPTION OF PROCEDURE:  The patient was identified in the holding room and transported to the operating room and placed in the supine position under the operating microscope.  The right eye was identified as the operative eye and it was prepped and draped in the usual sterile ophthalmic fashion.  The verion system was registered.   A 1.0 millimeter clear-corneal paracentesis was made at the 10:30 position. 0.5 ml of preservative-free 1% lidocaine with epinephrine was injected into the anterior chamber.  The anterior chamber was filled with Healon 5 viscoelastic.  A 2.4 millimeter keratome was used to make a near-clear corneal incision at the 8:00 position.  A curvilinear capsulorrhexis was made with a cystotome and capsulorrhexis forceps.  Balanced salt solution was used to hydrodissect and hydrodelineate the nucleus.   Phacoemulsification was then used in stop and chop fashion to remove the lens nucleus and epinucleus.  The remaining cortex was then removed using the irrigation  and aspiration handpiece. Healon was then placed into the capsular bag to distend it for lens placement.  A lens was then injected into the capsular bag.  The lens was rotated towards its final axis.  The remaining viscoelastic was aspirated.  The verion system was used to precisely align the lens successfully to 003.   Wounds were hydrated with balanced salt solution.  The anterior chamber was inflated to a physiologic pressure with balanced salt solution.   Intracameral vigamox 0.1 mL undiluted was injected into the eye and a drop placed onto the ocular surface.  No wound leaks were noted.  The patient was taken to the recovery room in stable condition without complications of anesthesia or surgery  Willey BladeBradley King 08/02/2017, 9:16 AM

## 2017-08-02 NOTE — Anesthesia Post-op Follow-up Note (Signed)
Anesthesia QCDR form completed.        

## 2017-08-02 NOTE — H&P (Signed)
The History and Physical notes are on paper, have been signed, and are to be scanned.   I have examined the patient and there are no changes to the H&P.   Willey BladeBradley Anthonio Mizzell 08/02/2017 8:33 AM

## 2018-01-01 ENCOUNTER — Telehealth: Payer: Self-pay | Admitting: *Deleted

## 2018-01-01 DIAGNOSIS — Z87891 Personal history of nicotine dependence: Secondary | ICD-10-CM

## 2018-01-01 DIAGNOSIS — Z122 Encounter for screening for malignant neoplasm of respiratory organs: Secondary | ICD-10-CM

## 2018-01-01 NOTE — Telephone Encounter (Signed)
Notified patient that annual lung cancer screening low dose CT scan is due currently or will be in near future. Confirmed that patient is within the age range of 55-77, and asymptomatic, (no signs or symptoms of lung cancer). Patient denies illness that would prevent curative treatment for lung cancer if found. Verified smoking history, (former, quit 2014, 100 pack year). The shared decision making visit was done 10/28/14. Patient is agreeable for CT scan being scheduled.

## 2018-01-10 ENCOUNTER — Ambulatory Visit: Payer: Medicare Other

## 2018-01-11 ENCOUNTER — Ambulatory Visit
Admission: RE | Admit: 2018-01-11 | Discharge: 2018-01-11 | Disposition: A | Discharge status: Home / Self Care | Payer: Medicare Other | Source: Ambulatory Visit | Attending: Nurse Practitioner | Admitting: Nurse Practitioner

## 2018-01-11 DIAGNOSIS — I700 Atherosclerosis of aorta: Secondary | ICD-10-CM | POA: Diagnosis not present

## 2018-01-11 DIAGNOSIS — J439 Emphysema, unspecified: Secondary | ICD-10-CM | POA: Insufficient documentation

## 2018-01-11 DIAGNOSIS — Z87891 Personal history of nicotine dependence: Secondary | ICD-10-CM | POA: Diagnosis present

## 2018-01-11 DIAGNOSIS — Z122 Encounter for screening for malignant neoplasm of respiratory organs: Secondary | ICD-10-CM | POA: Diagnosis present

## 2018-01-11 DIAGNOSIS — K861 Other chronic pancreatitis: Secondary | ICD-10-CM | POA: Diagnosis not present

## 2018-01-21 ENCOUNTER — Encounter: Payer: Self-pay | Admitting: *Deleted

## 2018-04-29 ENCOUNTER — Encounter: Payer: Self-pay | Admitting: *Deleted

## 2018-04-29 ENCOUNTER — Other Ambulatory Visit: Payer: Self-pay

## 2018-04-29 ENCOUNTER — Encounter
Admission: RE | Disposition: A | Discharge status: Home / Self Care | Payer: Self-pay | Source: Ambulatory Visit | Attending: Gastroenterology

## 2018-04-29 ENCOUNTER — Ambulatory Visit: Payer: Medicare Other | Admitting: Anesthesiology

## 2018-04-29 ENCOUNTER — Ambulatory Visit
Admission: RE | Admit: 2018-04-29 | Discharge: 2018-04-29 | Disposition: A | Discharge status: Home / Self Care | Payer: Medicare Other | Source: Ambulatory Visit | Attending: Gastroenterology | Admitting: Gastroenterology

## 2018-04-29 DIAGNOSIS — E7800 Pure hypercholesterolemia, unspecified: Secondary | ICD-10-CM | POA: Diagnosis not present

## 2018-04-29 DIAGNOSIS — M810 Age-related osteoporosis without current pathological fracture: Secondary | ICD-10-CM | POA: Insufficient documentation

## 2018-04-29 DIAGNOSIS — E039 Hypothyroidism, unspecified: Secondary | ICD-10-CM | POA: Diagnosis not present

## 2018-04-29 DIAGNOSIS — Z79890 Hormone replacement therapy (postmenopausal): Secondary | ICD-10-CM | POA: Insufficient documentation

## 2018-04-29 DIAGNOSIS — K5730 Diverticulosis of large intestine without perforation or abscess without bleeding: Secondary | ICD-10-CM | POA: Diagnosis not present

## 2018-04-29 DIAGNOSIS — Z9841 Cataract extraction status, right eye: Secondary | ICD-10-CM | POA: Insufficient documentation

## 2018-04-29 DIAGNOSIS — Z79899 Other long term (current) drug therapy: Secondary | ICD-10-CM | POA: Diagnosis not present

## 2018-04-29 DIAGNOSIS — Z791 Long term (current) use of non-steroidal anti-inflammatories (NSAID): Secondary | ICD-10-CM | POA: Diagnosis not present

## 2018-04-29 DIAGNOSIS — Z1211 Encounter for screening for malignant neoplasm of colon: Secondary | ICD-10-CM | POA: Insufficient documentation

## 2018-04-29 DIAGNOSIS — I10 Essential (primary) hypertension: Secondary | ICD-10-CM | POA: Insufficient documentation

## 2018-04-29 DIAGNOSIS — Z9842 Cataract extraction status, left eye: Secondary | ICD-10-CM | POA: Insufficient documentation

## 2018-04-29 DIAGNOSIS — Z87891 Personal history of nicotine dependence: Secondary | ICD-10-CM | POA: Diagnosis not present

## 2018-04-29 DIAGNOSIS — Z961 Presence of intraocular lens: Secondary | ICD-10-CM | POA: Insufficient documentation

## 2018-04-29 DIAGNOSIS — J439 Emphysema, unspecified: Secondary | ICD-10-CM | POA: Insufficient documentation

## 2018-04-29 HISTORY — DX: Nontoxic goiter, unspecified: E04.9

## 2018-04-29 HISTORY — DX: Unspecified cataract: H26.9

## 2018-04-29 HISTORY — DX: Anxiety disorder, unspecified: F41.9

## 2018-04-29 HISTORY — DX: Anemia, unspecified: D64.9

## 2018-04-29 HISTORY — PX: COLONOSCOPY WITH PROPOFOL: SHX5780

## 2018-04-29 SURGERY — COLONOSCOPY WITH PROPOFOL
Anesthesia: General

## 2018-04-29 MED ORDER — SODIUM CHLORIDE 0.9 % IV SOLN
INTRAVENOUS | Status: DC
  Administered 2018-04-29: 13:00:00 via INTRAVENOUS

## 2018-04-29 MED ORDER — LIDOCAINE HCL (CARDIAC) PF 100 MG/5ML IV SOSY
PREFILLED_SYRINGE | INTRAVENOUS | Status: DC | PRN
  Administered 2018-04-29: 50 mg via INTRAVENOUS

## 2018-04-29 MED ORDER — PROPOFOL 10 MG/ML IV BOLUS
INTRAVENOUS | Status: DC | PRN
  Administered 2018-04-29: 70 mg via INTRAVENOUS

## 2018-04-29 MED ORDER — PROPOFOL 500 MG/50ML IV EMUL
INTRAVENOUS | Status: DC | PRN
  Administered 2018-04-29: 125 ug/kg/min via INTRAVENOUS

## 2018-04-29 MED ORDER — LIDOCAINE HCL (PF) 2 % IJ SOLN
INTRAMUSCULAR | Status: AC
  Filled 2018-04-29: qty 10

## 2018-04-29 MED ORDER — PROPOFOL 500 MG/50ML IV EMUL
INTRAVENOUS | Status: AC
  Filled 2018-04-29: qty 50

## 2018-04-29 NOTE — Anesthesia Postprocedure Evaluation (Signed)
Anesthesia Post Note  Patient: Salena SanerCarolyn R Bryngelson  Procedure(s) Performed: COLONOSCOPY WITH PROPOFOL (N/A )  Patient location during evaluation: PACU Anesthesia Type: General Level of consciousness: awake and alert Pain management: pain level controlled Vital Signs Assessment: post-procedure vital signs reviewed and stable Respiratory status: spontaneous breathing, nonlabored ventilation, respiratory function stable and patient connected to nasal cannula oxygen Cardiovascular status: blood pressure returned to baseline and stable Postop Assessment: no apparent nausea or vomiting Anesthetic complications: no     Last Vitals:  Vitals:   04/29/18 1220 04/29/18 1334  BP: (!) 167/73 117/68  Pulse: (!) 54 62  Resp: 16 19  Temp: (!) 35.2 C 36.6 C  SpO2: 97% 99%    Last Pain:  Vitals:   04/29/18 1344  TempSrc:   PainSc: 0-No pain                 Yevette EdwardsJames G Cypress Fanfan

## 2018-04-29 NOTE — Anesthesia Preprocedure Evaluation (Signed)
Anesthesia Evaluation  Patient identified by MRN, date of birth, ID band Patient awake    Reviewed: Allergy & Precautions, H&P , NPO status , Patient's Chart, lab work & pertinent test results, reviewed documented beta blocker date and time   History of Anesthesia Complications (+) PONV and history of anesthetic complications  Airway Mallampati: II   Neck ROM: full    Dental  (+) Poor Dentition, Teeth Intact   Pulmonary neg pulmonary ROS, COPD, former smoker,    Pulmonary exam normal        Cardiovascular Exercise Tolerance: Good hypertension, On Medications negative cardio ROS Normal cardiovascular exam Rhythm:regular Rate:Normal     Neuro/Psych PSYCHIATRIC DISORDERS Anxiety Depression negative neurological ROS  negative psych ROS   GI/Hepatic negative GI ROS, Neg liver ROS,   Endo/Other  negative endocrine ROSHypothyroidism   Renal/GU negative Renal ROS  negative genitourinary   Musculoskeletal   Abdominal   Peds  Hematology negative hematology ROS (+) anemia ,   Anesthesia Other Findings Past Medical History: No date: Anemia No date: Anxiety No date: Arthritis     Comment:  osteoporosis too No date: Cataracts, bilateral No date: Cervical disc herniation No date: COPD (chronic obstructive pulmonary disease) (HCC)     Comment:  mild emphysema No date: Depression No date: Goiter No date: Hypercholesteremia No date: Hypertension     Comment:  8/18 Stress and Echo in 04/2017 WNL No date: Hypothyroidism No date: Osteoporosis 12/29/2015: Personal history of tobacco use, presenting hazards to  health No date: PONV (postoperative nausea and vomiting)     Comment:  only with hysterectomy No date: Thyroid disease Past Surgical History: No date: ABDOMINAL HYSTERECTOMY 1995: AUGMENTATION MAMMAPLASTY; Bilateral     Comment:  silicone 07/12/2017: CATARACT EXTRACTION W/PHACO; Left     Comment:  Procedure:  CATARACT EXTRACTION PHACO AND INTRAOCULAR               LENS PLACEMENT (IOC)-LEFT;  Surgeon: [place] CraneKing, Bradley Mark,               MD;  Location: [facility] ORS;  Service: Ophthalmology;                Laterality: Left;  Lot # [id number] H US:00:26.7 AP%:               8.0 CDE: 2.14  08/02/2017: CATARACT EXTRACTION W/PHACO; Right     Comment:  Procedure: CATARACT EXTRACTION PHACO AND INTRAOCULAR               LENS PLACEMENT (IOC)-RIGHT PRE DIABETIC;  Surgeon: [place] CraneKing,               Bradley Mark, MD;  Location: [facility] ORS;  Service:               Ophthalmology;  Laterality: Right;  US 00:19.9 AP%               12.8 CDE 2.55 [employer] lot # [id number] H No date: CESAREAN SECTION 06/2017: EYE SURGERY; Left     Comment:  cataract extraction No date: TUBAL LIGATION   Reproductive/Obstetrics negative OB ROS                             Anesthesia Physical Anesthesia Plan  ASA: III  Anesthesia Plan: General   Post-op Pain Management:    Induction:   PONV Risk Score and Plan:   Airway Management Planned:   Additional Equipment:   Intra-op  Plan:   Post-operative Plan:   Informed Consent: I have reviewed the patients History and Physical, chart, labs and discussed the procedure including the risks, benefits and alternatives for the proposed anesthesia with the patient or authorized representative who has indicated his/her understanding and acceptance.   Dental Advisory Given  Plan Discussed with: CRNA  Anesthesia Plan Comments:         Anesthesia Quick Evaluation

## 2018-04-29 NOTE — Anesthesia Post-op Follow-up Note (Signed)
Anesthesia QCDR form completed.        

## 2018-04-29 NOTE — Transfer of Care (Signed)
Immediate Anesthesia Transfer of Care Note  Patient: Christina Carr  Procedure(s) Performed: COLONOSCOPY WITH PROPOFOL (N/A )  Patient Location: PACU  Anesthesia Type:General  Level of Consciousness: sedated  Airway & Oxygen Therapy: Patient Spontanous Breathing and Patient connected to nasal cannula oxygen  Post-op Assessment: Report given to RN and Post -op Vital signs reviewed and stable  Post vital signs: Reviewed and stable  Last Vitals:  Vitals Value Taken Time  BP 117/68 04/29/2018  1:34 PM  Temp    Pulse 62 04/29/2018  1:34 PM  Resp 19 04/29/2018  1:34 PM  SpO2 99 % 04/29/2018  1:34 PM    Last Pain:  Vitals:   04/29/18 1220  TempSrc: Tympanic  PainSc: 0-No pain         Complications: No apparent anesthesia complications

## 2018-04-29 NOTE — Anesthesia Procedure Notes (Signed)
Performed by: Luella Gardenhire, CRNA Pre-anesthesia Checklist: Patient identified, Emergency Drugs available, Suction available, Patient being monitored and Timeout performed Patient Re-evaluated:Patient Re-evaluated prior to induction Oxygen Delivery Method: Nasal cannula Induction Type: IV induction       

## 2018-04-29 NOTE — Op Note (Signed)
[facility] Gastroenterology Patient Name: Christina Carr Procedure Date: 04/29/2018 12:53 PM MRN: [id number] Account #: [id number] Date of Birth: 10-26-1945 Admit Type: Outpatient Age: 72 Room: [facility] ENDO ROOM 1 Gender: Female Note Status: Finalized Procedure:            Colonoscopy Indications:          Screening for colorectal malignant neoplasm Providers:            Christena Deem, MD Referring MD:         Barbette Reichmann, MD (Referring MD) Medicines:            Monitored Anesthesia Care Complications:        No immediate complications. Procedure:            Pre-Anesthesia Assessment:                       - ASA Grade Assessment: III - A patient with severe                        systemic disease.                       After obtaining informed consent, the colonoscope was                        passed under direct vision. Throughout the procedure,                        the patient's blood pressure, pulse, and oxygen                        saturations were monitored continuously. The                        Colonoscope was introduced through the anus and                        advanced to the the cecum, identified by appendiceal                        orifice and ileocecal valve. The colonoscopy was                        performed with moderate difficulty due to significant                        looping. Successful completion of the procedure was                        aided by changing the patient to a supine position,                        changing the patient to a prone position and using                        manual pressure. The patient tolerated the procedure                        well. The quality of the bowel preparation was good. Findings:      Multiple small-mouthed diverticula were found in  the sigmoid colon and       distal descending colon. Impression:           - Diverticulosis in the sigmoid colon and in the distal   descending colon.                       - No specimens collected. Recommendation:       - Discharge patient to home.                       - Repeat colonoscopy in 10 years for screening purposes. Procedure Code(s):    --- Professional ---                       [phone], Colonoscopy, flexible; diagnostic, including                        collection of specimen(s) by brushing or washing, when                        performed (separate procedure) Diagnosis Code(s):    --- Professional ---                       Z12.11, Encounter for screening for malignant neoplasm                        of colon                       K57.30, Diverticulosis of large intestine without                        perforation or abscess without bleeding CPT copyright 2017 [employer]. All rights reserved. The codes documented in this report are preliminary and upon coder review may  be revised to meet current compliance requirements. Christena DeemMartin U Kacper Cartlidge, MD 04/29/2018 1:33:25 PM This report has been signed electronically. Number of Addenda: 0 Note Initiated On: 04/29/2018 12:53 PM Scope Withdrawal Time: 0 hours 5 minutes 54 seconds  Total Procedure Duration: 0 hours 23 minutes 18 seconds       [facility]

## 2018-04-29 NOTE — H&P (Signed)
Outpatient short stay form Pre-procedure 04/29/2018 12:39 PM Christina DeemMartin U Moriyah Byington MD  Primary Physician: Dr Barbette ReichmannVishwanath Hande  Reason for visit: Colonoscopy  History of present illness: Patient is a 72 year old female presenting today for colonoscopy.  Her last colonoscopy was in 2007.  She takes no aspirin or blood thinning agent.  She tolerated her prep well.  There is no family history of colon polyps or colon cancer to her knowledge.  He has no problems with rectal bleeding diarrhea blood in stool or abdominal pain.  Current Facility-Administered Medications:  .  0.9 %  sodium chloride infusion, , Intravenous, Continuous, Christina DeemSkulskie, Georgiann Neider U, MD, Last Rate: 20 mL/hr at 04/29/18 1236  Medications Prior to Admission  Medication Sig Dispense Refill Last Dose  . amLODipine (NORVASC) 10 MG tablet Take 10 mg by mouth daily before breakfast.    04/29/2018 at 0530  . atenolol (TENORMIN) 100 MG tablet Take 100 mg by mouth daily before breakfast.    04/29/2018 at 0530  . B Complex-C (SUPER B COMPLEX PO) Take 1 capsule by mouth daily after breakfast.   Past Week at Unknown time  . Calcium Carb-Cholecalciferol (CALCIUM 600 + D PO) Take 1 tablet by mouth daily after supper.   Past Week at Unknown time  . cholecalciferol (VITAMIN D) 1000 units tablet Take 1,000 Units by mouth daily after breakfast.   Past Week at Unknown time  . Choline Dihydrogen Citrate (CHOLINE CITRATE PO) Take 1 tablet daily after breakfast by mouth. Also contains inositol   Past Week at Unknown time  . Ginkgo Biloba 60 MG CAPS Take 60 mg by mouth 2 (two) times daily.    Past Week at Unknown time  . Glucosamine Sulfate 1000 MG CAPS Take 1,000 mg by mouth 2 (two) times daily. After breakfast and after supper.   Past Week at Unknown time  . Inositol 324 MG TABS Take by mouth.    Past Week at Unknown time  . levothyroxine (SYNTHROID, LEVOTHROID) 112 MCG tablet Take 112 mcg by mouth daily before breakfast.   04/29/2018 at 0530  . lisinopril  (PRINIVIL,ZESTRIL) 10 MG tablet Take 10 mg by mouth daily before breakfast.   04/29/2018 at 0530  . meloxicam (MOBIC) 15 MG tablet Take 15 mg by mouth daily after supper.    Past Week at Unknown time  . Polyvinyl Alcohol-Povidone PF (REFRESH) 1.4-0.6 % SOLN Place 1-2 drops into the right eye 3 (three) times daily as needed (for dry eyes.).    Past Week at Unknown time  . simvastatin (ZOCOR) 20 MG tablet Take 20 mg by mouth daily at 8 pm. 2100   04/28/2018 at 1800  . Difluprednate (DUREZOL) 0.05 % EMUL Place 1 drop into the left eye 2 (two) times daily. Into operative eye(s)    Completed Course at Unknown time     Allergies  Allergen Reactions  . Alendronate     Chest pain  . Hydrochlorothiazide      Past Medical History:  Diagnosis Date  . Anemia   . Anxiety   . Arthritis    osteoporosis too  . Cataracts, bilateral   . Cervical disc herniation   . 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 Depression   . Goiter   . Hypercholesteremia   . Hypertension    8/18 Stress and Echo in 04/2017 WNL  . Hypothyroidism   . Osteoporosis   . Personal history of tobacco use, presenting hazards to health 12/29/2015  .  PONV (postoperative nausea and vomiting)    only with hysterectomy  . Thyroid disease     Review of systems:      Physical Exam    Heart and lungs: Regular rate and rhythm without rub or gallop, lungs are bilaterally clear.    HEENT: Normocephalic atraumatic eyes are anicteric    Other:    Pertinant exam for procedure: Soft nontender nondistended bowel sounds positive normoactive    Planned proceedures: Colonoscopy and indicated procedures. I have discussed the risks benefits and complications of procedures to include not limited to bleeding, infection, perforation and the risk of sedation and the patient wishes to proceed.    Christina Deem, MD Gastroenterology 04/29/2018  12:39 PM

## 2018-04-30 ENCOUNTER — Encounter: Payer: Self-pay | Admitting: Gastroenterology

## 2018-08-01 ENCOUNTER — Other Ambulatory Visit: Payer: Self-pay | Admitting: Gastroenterology

## 2018-08-01 DIAGNOSIS — R1312 Dysphagia, oropharyngeal phase: Secondary | ICD-10-CM

## 2018-08-14 ENCOUNTER — Ambulatory Visit
Admission: RE | Admit: 2018-08-14 | Discharge: 2018-08-14 | Disposition: A | Discharge status: Home / Self Care | Payer: Medicare Other | Source: Ambulatory Visit | Attending: Gastroenterology | Admitting: Gastroenterology

## 2018-08-14 DIAGNOSIS — R1312 Dysphagia, oropharyngeal phase: Secondary | ICD-10-CM | POA: Insufficient documentation

## 2018-08-14 DIAGNOSIS — R1314 Dysphagia, pharyngoesophageal phase: Secondary | ICD-10-CM | POA: Diagnosis not present

## 2018-08-14 NOTE — Therapy (Signed)
[facility] DIAGNOSTIC RADIOLOGY [address] [place], [place], [address] Phone: [phone]   Fax:     Modified Barium Swallow  Patient Details  Name: Christina Carr MRN: [id number] Date of Birth: [birth year] No data recorded  Encounter Date: 08/14/2018  End of Session - 08/14/18 1346    Visit Number  1    Number of Visits  1    Date for SLP Re-Evaluation  08/14/18    SLP Start Time  1300    SLP Stop Time   1346    SLP Time Calculation (min)  46 min    Activity Tolerance  Patient tolerated treatment well       Past Medical History:  Diagnosis Date  . Anemia   . Anxiety   . Arthritis    osteoporosis too  . Cataracts, bilateral   . Cervical disc herniation   . 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 Depression   . Goiter   . Hypercholesteremia   . Hypertension    8/18 Stress and Echo in 04/2017 WNL  . Hypothyroidism   . Osteoporosis   . Personal history of tobacco use, presenting hazards to health 12/29/2015  . PONV (postoperative nausea and vomiting)    only with hysterectomy  . Thyroid disease     Past Surgical History:  Procedure Laterality Date  . ABDOMINAL HYSTERECTOMY    . AUGMENTATION MAMMAPLASTY Bilateral 1995   silicone  . CATARACT EXTRACTION W/PHACO Left 07/12/2017   Procedure: CATARACT EXTRACTION PHACO AND INTRAOCULAR LENS PLACEMENT (IOC)-LEFT;  Surgeon: Nevada Crane, MD;  Location: [facility] ORS;  Service: Ophthalmology;  Laterality: Left;  Lot # [id number] H US:00:26.7 AP%: 8.0 CDE: 2.14   . CATARACT EXTRACTION W/PHACO Right 08/02/2017   Procedure: CATARACT EXTRACTION PHACO AND INTRAOCULAR LENS PLACEMENT (IOC)-RIGHT PRE DIABETIC;  Surgeon: Nevada Crane, MD;  Location: [facility] ORS;  Service: Ophthalmology;  Laterality: Right;  [place] 00:19.9 AP% 12.8 CDE 2.55 Fly[employer] Pack lot # [id number]  . CESAREAN SECTION    . COLONOSCOPY    . COLONOSCOPY WITH PROPOFOL N/A 04/29/2018   Procedure:  COLONOSCOPY WITH PROPOFOL;  Surgeon: Christena Deem, MD;  Location: [facility] ENDOSCOPY;  Service: Endoscopy;  Laterality: N/A;  . EYE SURGERY Left 06/2017   cataract extraction  . TUBAL LIGATION      There were no vitals filed for this visit.     Subjective: Patient behavior: (alertness, ability to follow instructions, etc.):  The patient is alert, able to express her swallowing concerns, and follow directions for this study.  She is hard of hearing.  Chief complaint: "a bubble in my throat" when swallowing liquids with severe epigastric pain as she tries to burp out the bubble   Objective:  Radiological Procedure: A videoflouroscopic evaluation of oral-preparatory, reflex initiation, and pharyngeal phases of the swallow was performed; as well as a screening of the upper esophageal phase.  I. POSTURE: Upright in MBS chair  II. VIEW: Lateral  III. COMPENSATORY STRATEGIES: N/A  IV. BOLUSES ADMINISTERED:   Thin Liquid: 1 small, 8 rapid consecutive   Nectar-thick Liquid: 3 rapid consecutive   Honey-thick Liquid: DNT   Puree: 2 teaspoon presentations   Mechanical Soft: 1/4 graham cracker in applesauce  V. RESULTS OF EVALUATION: A. ORAL PREPARATORY PHASE: (The lips, tongue, and velum are observed for strength and coordination)       **Overall Severity Rating: within normal limits   B. SWALLOW INITIATION/REFLEX: (The  reflex is normal if "triggered" by the time the bolus reached the base of the tongue)  **Overall Severity Rating: within normal limits   C. PHARYNGEAL PHASE: (Pharyngeal function is normal if the bolus shows rapid, smooth, and continuous transit through the pharynx and there is no pharyngeal residue after the swallow)  **Overall Severity Rating: Minimal; reduced pharyngeal pressure generation (decreased hyolaryngeal excursion, decreased tongue base retraction) with trace pharyngeal residue.  D. LARYNGEAL PENETRATION: (Material entering into the laryngeal  inlet/vestibule but not aspirated)  NONE  E. ASPIRATION: NONE  F. ESOPHAGEAL PHASE: (Screening of the upper esophagus) An esophageal scan shows distal retention of liquid and momentary pause of a barium tablet before entry into the stomach.  Please see Dr. Elvis CoilJordan's report for further information regarding esophageal function.    ASSESSMENT: This 72 year old woman; with complaint of feeling "a bubble in my throat" when swallowing liquids with severe epigastric pain as she tries to burp out the bubble; is presenting with minimal pharyngeal dysphagia characterized by minimally reduced pharyngeal pressure generation with trace pharyngeal residue.  Oral control of the bolus including oral hold, rotary mastication, and anterior to posterior transfer is within normal limits.  Pharyngeal swallow initiation is within functional limits.  There is no observed laryngeal penetration or tracheal aspiration.  The patient is not at significant risk for prandial aspiration and her complaints do not appear to be due to oropharyngeal swallow function.  An esophageal scan shows distal retention of liquid and momentary pause of a barium tablet before entry into the stomach.  Please see Dr. Elvis CoilJordan's report for further information regarding esophageal function.  The patient has been assured that her swallowing is safe from an aspiration point of view.  She is encouraged to continue to drink carefully to avoid the difficulty.  PLAN/RECOMMENDATIONS:   A. Diet: Regular   B. Swallowing Precautions: Continue to swallow "carefully"   C. Recommended consultation to: follow up with MDs as recommended   D. Therapy recommendations: speech therapy is not indicated   E. Results and recommendations were discussed with the patient immediately following the study and the final report routed to the referring practitioner.    Pharyngoesophageal dysphagia - Plan: DG OP Swallowing Func-Medicare/Speech Path, DG OP Swallowing  Func-Medicare/Speech Path        Problem List Patient Active Problem List   Diagnosis Date Noted  . Personal history of tobacco use, presenting hazards to health 12/29/2015   Dollene PrimroseSusan G Macy Lingenfelter, MS/CCC- SLP  Leandrew KoyanagiAbernathy, Susie 08/14/2018, 1:46 PM  [facility] DIAGNOSTIC RADIOLOGY [address] [place], [place], [id number] Phone: [phone]   Fax:     Name: Salena SanerCarolyn R Battaglini MRN: [id number] Date of Birth: 01/02/1946

## 2018-12-15 ENCOUNTER — Encounter: Payer: Self-pay | Admitting: *Deleted

## 2018-12-19 ENCOUNTER — Encounter: Payer: Self-pay | Admitting: *Deleted

## 2019-02-26 ENCOUNTER — Telehealth: Payer: Self-pay | Admitting: *Deleted

## 2019-02-26 DIAGNOSIS — Z122 Encounter for screening for malignant neoplasm of respiratory organs: Secondary | ICD-10-CM

## 2019-02-26 DIAGNOSIS — Z87891 Personal history of nicotine dependence: Secondary | ICD-10-CM

## 2019-02-26 NOTE — Telephone Encounter (Signed)
Patient has been notified that annual lung cancer screening low dose CT scan is due currently or will be in near future. Confirmed that patient is within the age range of 55-77, and asymptomatic, (no signs or symptoms of lung cancer). Patient denies illness that would prevent curative treatment for lung cancer if found. Verified smoking history, (former, quit 2014, 100 pack year). The shared decision making visit was done 10/28/14. Patient is agreeable for CT scan being scheduled.  

## 2019-03-03 ENCOUNTER — Other Ambulatory Visit: Payer: Self-pay

## 2019-03-03 ENCOUNTER — Ambulatory Visit
Admission: RE | Admit: 2019-03-03 | Discharge: 2019-03-03 | Disposition: A | Discharge status: Home / Self Care | Payer: Medicare Other | Source: Ambulatory Visit | Attending: Nurse Practitioner | Admitting: Nurse Practitioner

## 2019-03-03 DIAGNOSIS — Z122 Encounter for screening for malignant neoplasm of respiratory organs: Secondary | ICD-10-CM | POA: Insufficient documentation

## 2019-03-03 DIAGNOSIS — Z87891 Personal history of nicotine dependence: Secondary | ICD-10-CM | POA: Diagnosis present

## 2019-03-05 ENCOUNTER — Encounter: Payer: Self-pay | Admitting: *Deleted

## 2019-05-26 ENCOUNTER — Other Ambulatory Visit: Payer: Self-pay

## 2019-05-26 DIAGNOSIS — Z20822 Contact with and (suspected) exposure to covid-19: Secondary | ICD-10-CM

## 2019-05-28 LAB — NOVEL CORONAVIRUS, NAA: SARS-CoV-2, NAA: NOT DETECTED

## 2019-08-27 ENCOUNTER — Other Ambulatory Visit: Payer: Self-pay | Admitting: Internal Medicine

## 2019-08-27 DIAGNOSIS — Z1231 Encounter for screening mammogram for malignant neoplasm of breast: Secondary | ICD-10-CM

## 2019-09-11 ENCOUNTER — Other Ambulatory Visit: Payer: Self-pay

## 2019-09-11 ENCOUNTER — Ambulatory Visit: Payer: Medicare Other | Attending: Internal Medicine

## 2019-09-11 DIAGNOSIS — Z20822 Contact with and (suspected) exposure to covid-19: Secondary | ICD-10-CM

## 2019-09-13 LAB — NOVEL CORONAVIRUS, NAA: SARS-CoV-2, NAA: NOT DETECTED

## 2020-03-01 ENCOUNTER — Telehealth: Payer: Self-pay

## 2020-03-01 DIAGNOSIS — Z122 Encounter for screening for malignant neoplasm of respiratory organs: Secondary | ICD-10-CM

## 2020-03-01 DIAGNOSIS — Z87891 Personal history of nicotine dependence: Secondary | ICD-10-CM

## 2020-03-01 NOTE — Telephone Encounter (Signed)
Message left notifying patient that it is time to schedule the low dose lung cancer screening CT scan.  Instructed patient to return call to Shawn Perkins at [phone] to verify information prior to CT scan being scheduled.    

## 2020-03-02 NOTE — Telephone Encounter (Signed)
Patient has been notified that annual lung cancer screening low dose CT scan is due currently or will be in near future. Confirmed that patient is within the age range of 55-77, and asymptomatic, (no signs or symptoms of lung cancer). Patient denies illness that would prevent curative treatment for lung cancer if found. Verified smoking history, (former, quit 2014, 100 pack year). The shared decision making visit was done 10/28/14. Patient is agreeable for CT scan being scheduled.

## 2020-03-02 NOTE — Addendum Note (Signed)
Addended by: Jonne Ply on: 03/02/2020 10:27 AM   Modules accepted: Orders

## 2020-03-08 ENCOUNTER — Other Ambulatory Visit: Payer: Self-pay

## 2020-03-08 ENCOUNTER — Ambulatory Visit
Admission: RE | Admit: 2020-03-08 | Discharge: 2020-03-08 | Disposition: A | Discharge status: Home / Self Care | Payer: Medicare PPO | Source: Ambulatory Visit | Attending: Oncology | Admitting: Oncology

## 2020-03-08 DIAGNOSIS — Z122 Encounter for screening for malignant neoplasm of respiratory organs: Secondary | ICD-10-CM | POA: Diagnosis present

## 2020-03-08 DIAGNOSIS — Z87891 Personal history of nicotine dependence: Secondary | ICD-10-CM | POA: Diagnosis not present

## 2020-03-15 ENCOUNTER — Encounter: Payer: Self-pay | Admitting: *Deleted

## 2020-04-15 ENCOUNTER — Other Ambulatory Visit: Payer: Self-pay | Admitting: Internal Medicine

## 2020-04-15 DIAGNOSIS — M5412 Radiculopathy, cervical region: Secondary | ICD-10-CM

## 2020-04-15 DIAGNOSIS — M545 Low back pain, unspecified: Secondary | ICD-10-CM

## 2020-05-01 ENCOUNTER — Ambulatory Visit: Payer: Medicare PPO

## 2021-03-09 ENCOUNTER — Telehealth: Payer: Self-pay

## 2021-03-09 NOTE — Telephone Encounter (Signed)
Left message for patient to notify them that it is time to schedule annual low dose lung cancer screening CT scan. Instructed patient to call back ([phone]) to verify information and schedule.  

## 2021-03-17 ENCOUNTER — Other Ambulatory Visit: Payer: Self-pay | Admitting: *Deleted

## 2021-03-17 DIAGNOSIS — Z122 Encounter for screening for malignant neoplasm of respiratory organs: Secondary | ICD-10-CM

## 2021-03-17 DIAGNOSIS — Z87891 Personal history of nicotine dependence: Secondary | ICD-10-CM

## 2021-03-17 NOTE — Progress Notes (Signed)
Contacted and scheduled for annual lung screening scan. Patient is a former smoker, quit 2014, 100 pack year history.

## 2021-03-23 ENCOUNTER — Other Ambulatory Visit: Payer: Self-pay

## 2021-03-23 ENCOUNTER — Ambulatory Visit
Admission: RE | Admit: 2021-03-23 | Discharge: 2021-03-23 | Disposition: A | Discharge status: Home / Self Care | Payer: Medicare PPO | Source: Ambulatory Visit | Attending: Oncology | Admitting: Oncology

## 2021-03-23 DIAGNOSIS — Z87891 Personal history of nicotine dependence: Secondary | ICD-10-CM | POA: Insufficient documentation

## 2021-03-23 DIAGNOSIS — Z122 Encounter for screening for malignant neoplasm of respiratory organs: Secondary | ICD-10-CM | POA: Diagnosis present

## 2021-04-12 ENCOUNTER — Encounter: Payer: Self-pay | Admitting: *Deleted

## 2021-05-02 ENCOUNTER — Other Ambulatory Visit: Payer: Self-pay | Admitting: Internal Medicine

## 2021-05-02 DIAGNOSIS — M5412 Radiculopathy, cervical region: Secondary | ICD-10-CM

## 2021-05-11 ENCOUNTER — Ambulatory Visit: Payer: Medicare PPO

## 2021-05-15 ENCOUNTER — Ambulatory Visit
Admission: RE | Admit: 2021-05-15 | Discharge: 2021-05-15 | Disposition: A | Discharge status: Home / Self Care | Payer: Medicare PPO | Source: Ambulatory Visit | Attending: Internal Medicine | Admitting: Internal Medicine

## 2021-05-15 ENCOUNTER — Other Ambulatory Visit: Payer: Self-pay

## 2021-05-15 DIAGNOSIS — M5412 Radiculopathy, cervical region: Secondary | ICD-10-CM

## 2021-05-15 MED ORDER — GADOBUTROL 1 MMOL/ML IV SOLN
7.0000 mL | Freq: Once | INTRAVENOUS | Status: AC | PRN
  Administered 2021-05-15: 7.5 mL via INTRAVENOUS

## 2022-05-15 ENCOUNTER — Other Ambulatory Visit: Payer: Self-pay

## 2022-05-15 ENCOUNTER — Telehealth: Payer: Self-pay | Admitting: Acute Care

## 2022-05-15 NOTE — Telephone Encounter (Signed)
Attempted to reach pt to schedule annual LDCT-LVMM 

## 2022-05-16 ENCOUNTER — Other Ambulatory Visit: Payer: Self-pay | Admitting: *Deleted

## 2022-05-16 DIAGNOSIS — Z87891 Personal history of nicotine dependence: Secondary | ICD-10-CM

## 2022-05-16 DIAGNOSIS — Z122 Encounter for screening for malignant neoplasm of respiratory organs: Secondary | ICD-10-CM

## 2022-05-22 ENCOUNTER — Ambulatory Visit
Admission: RE | Admit: 2022-05-22 | Discharge: 2022-05-22 | Disposition: A | Discharge status: Home / Self Care | Payer: Medicare PPO | Source: Ambulatory Visit | Attending: Acute Care | Admitting: Acute Care

## 2022-05-22 DIAGNOSIS — Z122 Encounter for screening for malignant neoplasm of respiratory organs: Secondary | ICD-10-CM | POA: Diagnosis present

## 2022-05-22 DIAGNOSIS — Z87891 Personal history of nicotine dependence: Secondary | ICD-10-CM | POA: Insufficient documentation

## 2022-05-24 ENCOUNTER — Other Ambulatory Visit: Payer: Self-pay

## 2022-05-24 DIAGNOSIS — Z87891 Personal history of nicotine dependence: Secondary | ICD-10-CM

## 2022-05-24 DIAGNOSIS — Z122 Encounter for screening for malignant neoplasm of respiratory organs: Secondary | ICD-10-CM

## 2022-06-22 ENCOUNTER — Observation Stay
Admission: EM | Admit: 2022-06-22 | Discharge: 2022-06-23 | Disposition: A | Discharge status: Home / Self Care | Payer: Medicare PPO | Attending: Hospitalist | Admitting: Hospitalist

## 2022-06-22 ENCOUNTER — Emergency Department: Payer: Medicare PPO

## 2022-06-22 DIAGNOSIS — J449 Chronic obstructive pulmonary disease, unspecified: Secondary | ICD-10-CM | POA: Diagnosis not present

## 2022-06-22 DIAGNOSIS — B019 Varicella without complication: Secondary | ICD-10-CM | POA: Insufficient documentation

## 2022-06-22 DIAGNOSIS — R0602 Shortness of breath: Secondary | ICD-10-CM | POA: Diagnosis not present

## 2022-06-22 DIAGNOSIS — J45909 Unspecified asthma, uncomplicated: Secondary | ICD-10-CM | POA: Insufficient documentation

## 2022-06-22 DIAGNOSIS — R778 Other specified abnormalities of plasma proteins: Secondary | ICD-10-CM | POA: Diagnosis not present

## 2022-06-22 DIAGNOSIS — Z79899 Other long term (current) drug therapy: Secondary | ICD-10-CM | POA: Insufficient documentation

## 2022-06-22 DIAGNOSIS — E039 Hypothyroidism, unspecified: Secondary | ICD-10-CM | POA: Diagnosis not present

## 2022-06-22 DIAGNOSIS — Z7982 Long term (current) use of aspirin: Secondary | ICD-10-CM | POA: Diagnosis not present

## 2022-06-22 DIAGNOSIS — I10 Essential (primary) hypertension: Secondary | ICD-10-CM | POA: Diagnosis not present

## 2022-06-22 DIAGNOSIS — R531 Weakness: Secondary | ICD-10-CM | POA: Diagnosis present

## 2022-06-22 DIAGNOSIS — I4891 Unspecified atrial fibrillation: Secondary | ICD-10-CM | POA: Diagnosis not present

## 2022-06-22 DIAGNOSIS — R7989 Other specified abnormal findings of blood chemistry: Secondary | ICD-10-CM

## 2022-06-22 DIAGNOSIS — Z87891 Personal history of nicotine dependence: Secondary | ICD-10-CM | POA: Diagnosis not present

## 2022-06-22 LAB — URINALYSIS, ROUTINE W REFLEX MICROSCOPIC
Bilirubin Urine: NEGATIVE
Glucose, UA: NEGATIVE mg/dL
Hgb urine dipstick: NEGATIVE
Ketones, ur: NEGATIVE mg/dL
Leukocytes,Ua: NEGATIVE
Nitrite: NEGATIVE
Protein, ur: NEGATIVE mg/dL
Specific Gravity, Urine: 1.018 (ref 1.005–1.030)
pH: 5 (ref 5.0–8.0)

## 2022-06-22 LAB — CBC WITH DIFFERENTIAL/PLATELET
Abs Immature Granulocytes: 0.05 10*3/uL (ref 0.00–0.07)
Basophils Absolute: 0 10*3/uL (ref 0.0–0.1)
Basophils Relative: 0 %
Eosinophils Absolute: 0 10*3/uL (ref 0.0–0.5)
Eosinophils Relative: 0 %
HCT: 41 % (ref 36.0–46.0)
Hemoglobin: 13.9 g/dL (ref 12.0–15.0)
Immature Granulocytes: 1 %
Lymphocytes Relative: 16 %
Lymphs Abs: 1.8 10*3/uL (ref 0.7–4.0)
MCH: 29.8 pg (ref 26.0–34.0)
MCHC: 33.9 g/dL (ref 30.0–36.0)
MCV: 87.8 fL (ref 80.0–100.0)
Monocytes Absolute: 0.9 10*3/uL (ref 0.1–1.0)
Monocytes Relative: 8 %
Neutro Abs: 8.2 10*3/uL — ABNORMAL HIGH (ref 1.7–7.7)
Neutrophils Relative %: 75 %
Platelets: 247 10*3/uL (ref 150–400)
RBC: 4.67 MIL/uL (ref 3.87–5.11)
RDW: 12 % (ref 11.5–15.5)
WBC: 11 10*3/uL — ABNORMAL HIGH (ref 4.0–10.5)
nRBC: 0 % (ref 0.0–0.2)

## 2022-06-22 LAB — LIPASE, BLOOD: Lipase: 63 U/L — ABNORMAL HIGH (ref 11–51)

## 2022-06-22 LAB — COMPREHENSIVE METABOLIC PANEL
ALT: 26 U/L (ref 0–44)
AST: 37 U/L (ref 15–41)
Albumin: 3.9 g/dL (ref 3.5–5.0)
Alkaline Phosphatase: 67 U/L (ref 38–126)
Anion gap: 12 (ref 5–15)
BUN: 17 mg/dL (ref 8–23)
CO2: 24 mmol/L (ref 22–32)
Calcium: 9 mg/dL (ref 8.9–10.3)
Chloride: 98 mmol/L (ref 98–111)
Creatinine, Ser: 1.21 mg/dL — ABNORMAL HIGH (ref 0.44–1.00)
GFR, Estimated: 46 mL/min — ABNORMAL LOW (ref >60)
Glucose, Bld: 136 mg/dL — ABNORMAL HIGH (ref 70–99)
Potassium: 4.1 mmol/L (ref 3.5–5.1)
Sodium: 134 mmol/L — ABNORMAL LOW (ref 135–145)
Total Bilirubin: 0.7 mg/dL (ref 0.3–1.2)
Total Protein: 7.4 g/dL (ref 6.5–8.1)

## 2022-06-22 LAB — TROPONIN I (HIGH SENSITIVITY)
Troponin I (High Sensitivity): 126 ng/L (ref <18)
Troponin I (High Sensitivity): 139 ng/L (ref <18)
Troponin I (High Sensitivity): 168 ng/L (ref <18)

## 2022-06-22 LAB — TSH: TSH: 0.315 u[IU]/mL — ABNORMAL LOW (ref 0.350–4.500)

## 2022-06-22 LAB — PROTIME-INR
INR: 1.1 (ref 0.8–1.2)
Prothrombin Time: 14.2 seconds (ref 11.4–15.2)

## 2022-06-22 LAB — LIPID PANEL
Cholesterol: 148 mg/dL (ref 0–200)
HDL: 45 mg/dL (ref >40)
LDL Cholesterol: 84 mg/dL (ref 0–99)
Total CHOL/HDL Ratio: 3.3 RATIO
Triglycerides: 95 mg/dL (ref <150)
VLDL: 19 mg/dL (ref 0–40)

## 2022-06-22 LAB — BRAIN NATRIURETIC PEPTIDE: B Natriuretic Peptide: 620.1 pg/mL — ABNORMAL HIGH (ref 0.0–100.0)

## 2022-06-22 LAB — LACTIC ACID, PLASMA: Lactic Acid, Venous: 1.8 mmol/L (ref 0.5–1.9)

## 2022-06-22 LAB — APTT: aPTT: 32 seconds (ref 24–36)

## 2022-06-22 LAB — D-DIMER, QUANTITATIVE: D-Dimer, Quant: 1.85 ug/mL-FEU — ABNORMAL HIGH (ref 0.00–0.50)

## 2022-06-22 MED ORDER — ONDANSETRON HCL 4 MG PO TABS: 4.0000 mg | ORAL_TABLET | Freq: Four times a day (QID) | ORAL | Status: DC | PRN

## 2022-06-22 MED ORDER — IOHEXOL 350 MG/ML SOLN
75.0000 mL | Freq: Once | INTRAVENOUS | Status: AC | PRN
  Administered 2022-06-22: 75 mL via INTRAVENOUS

## 2022-06-22 MED ORDER — DONEPEZIL HCL 5 MG PO TABS
5.0000 mg | ORAL_TABLET | Freq: Every day | ORAL | Status: DC
  Administered 2022-06-22: 5 mg via ORAL
  Filled 2022-06-22: qty 1

## 2022-06-22 MED ORDER — SODIUM CHLORIDE 0.9 % IV SOLN: INTRAVENOUS | Status: DC

## 2022-06-22 MED ORDER — ASPIRIN 81 MG PO TBEC
81.0000 mg | DELAYED_RELEASE_TABLET | Freq: Every day | ORAL | Status: DC
  Administered 2022-06-23: 81 mg via ORAL
  Filled 2022-06-22: qty 1

## 2022-06-22 MED ORDER — SODIUM CHLORIDE 0.9 % IV BOLUS
1000.0000 mL | Freq: Once | INTRAVENOUS | Status: AC
  Administered 2022-06-22: 1000 mL via INTRAVENOUS

## 2022-06-22 MED ORDER — ASPIRIN 81 MG PO CHEW
324.0000 mg | CHEWABLE_TABLET | Freq: Once | ORAL | Status: AC
  Administered 2022-06-22: 324 mg via ORAL
  Filled 2022-06-22: qty 4

## 2022-06-22 MED ORDER — ONDANSETRON HCL 4 MG/2ML IJ SOLN: 4.0000 mg | Freq: Four times a day (QID) | INTRAMUSCULAR | Status: DC | PRN

## 2022-06-22 MED ORDER — MORPHINE SULFATE (PF) 2 MG/ML IV SOLN: 2.0000 mg | INTRAVENOUS | Status: DC | PRN

## 2022-06-22 MED ORDER — LEVOTHYROXINE SODIUM 100 MCG PO TABS
100.0000 ug | ORAL_TABLET | Freq: Every day | ORAL | Status: DC
  Administered 2022-06-23: 100 ug via ORAL
  Filled 2022-06-22: qty 1

## 2022-06-22 MED ORDER — SIMVASTATIN 10 MG PO TABS: 40.0000 mg | ORAL_TABLET | Freq: Every day | ORAL | Status: DC

## 2022-06-22 MED ORDER — ONDANSETRON 4 MG PO TBDP: 4.0000 mg | ORAL_TABLET | Freq: Once | ORAL | Status: DC

## 2022-06-22 MED ORDER — ACETAMINOPHEN 650 MG RE SUPP: 650.0000 mg | Freq: Four times a day (QID) | RECTAL | Status: DC | PRN

## 2022-06-22 MED ORDER — LEVALBUTEROL HCL 0.63 MG/3ML IN NEBU: 0.6300 mg | INHALATION_SOLUTION | Freq: Four times a day (QID) | RESPIRATORY_TRACT | Status: DC | PRN

## 2022-06-22 MED ORDER — HEPARIN (PORCINE) 25000 UT/250ML-% IV SOLN
1250.0000 [IU]/h | INTRAVENOUS | Status: DC
  Administered 2022-06-22: 1250 [IU]/h via INTRAVENOUS
  Filled 2022-06-22: qty 250

## 2022-06-22 MED ORDER — HEPARIN BOLUS VIA INFUSION
4000.0000 [IU] | Freq: Once | INTRAVENOUS | Status: AC
  Administered 2022-06-22: 4000 [IU] via INTRAVENOUS
  Filled 2022-06-22: qty 4000

## 2022-06-22 MED ORDER — ACETAMINOPHEN 325 MG PO TABS: 650.0000 mg | ORAL_TABLET | Freq: Four times a day (QID) | ORAL | Status: DC | PRN

## 2022-06-22 MED ORDER — DULOXETINE HCL 20 MG PO CPEP
20.0000 mg | ORAL_CAPSULE | Freq: Every day | ORAL | Status: DC
  Administered 2022-06-22 – 2022-06-23 (×2): 20 mg via ORAL
  Filled 2022-06-22 (×2): qty 1

## 2022-06-22 NOTE — ED Provider Triage Note (Signed)
Emergency Medicine Provider Triage Evaluation Note  CARRY WEESNER , a 76 y.o. female  was evaluated in triage.  Pt complains of weakness.  Nausea vomiting.  Patient thinks it is from the valacyclovir that she has been taking for shingles.  No fever or chills.  No chest pain or shortness of breath..  Review of Systems  Positive: Weakness Negative: Fever  Physical Exam  BP 95/72   Pulse 85   Temp (!) 97.5 F (36.4 C) (Oral)   Resp 17   Ht 5\' 9"  (1.753 m)   Wt 85 kg   SpO2 93%   BMI 27.67 kg/m  Gen:   Awake, no distress   Resp:  Normal effort  MSK:   Moves extremities without difficulty  Other:    Medical Decision Making  Medically screening exam initiated at 11:11 AM.  Appropriate orders placed.  KALEIGH SPIEGELMAN was informed that the remainder of the evaluation will be completed by another provider, this initial triage assessment does not replace that evaluation, and the importance of remaining in the ED until their evaluation is complete.  Weakness protocols initiated   Versie Starks, PA-C 06/22/22 1113

## 2022-06-22 NOTE — H&P (Addendum)
History and Physical    GENESYS COGGESHALL VBT:[id number] DOB: 05-26-1946 DOA: 06/22/2022  PCP: Barbette Reichmann, MD  Patient coming from: home I have personally briefly reviewed patient's old medical records in [facility]  Chief Complaint: weakness/near syncope/ irregular HR  HPI: Christina Carr is a 76 y.o. female with medical history significant of  Hypothyrodism, HLD, HTN , depression , COPD , Anemia, Anxiety,  who presents to ED with complaint of n/v severe weakness and near syncope as well as abnormal heart rhythm. Patient states that she was diagnosed with VZV  around three days ago and started on valtrex and prednisone. She states after starting medications she became very weak and n/v/d. She states her weakness progressed and this am she was so weak that on standing she slid to the floor . She states she called her friend who was a [employer] fundraiser who took her vitals and told her her hr heart was irregular and her blood pressure was low. Due to this she presented to the ED.She notes no associated fever/ chills/ abdominal pain , dysuria/ chest pain / noted mild sob but that has resolved. She also notes n/v/d has also resolved.  However, fatigue is still severe.  ED Course:  Afeb, bp 95/72 Sat 93 %on ra  Wbc: 11, hrb 13.9, plt 247  Na 134, K 4.1 , glu 136, cr 1.21 Lipase 63 Ce 126,139 Bnp 620 D-dimer 1.85 Cxr NAD CTPA Neg for pe  EKG : Afib  Tx asa ,heparin ,zofran Review of Systems: As per HPI otherwise 10 point review of systems negative.   Past Medical History:  Diagnosis Date   Anemia    Anxiety    Arthritis    osteoporosis too   Cataracts, bilateral    Cervical disc herniation    COPD (chronic obstructive pulmonary disease) (HCC)    mild emphysema   Depression    Goiter    Hypercholesteremia    Hypertension    8/18 Stress and Echo in 04/2017 WNL   Hypothyroidism    Osteoporosis    Personal history of tobacco use, presenting hazards to health 12/29/2015   PONV  (postoperative nausea and vomiting)    only with hysterectomy   Thyroid disease     Past Surgical History:  Procedure Laterality Date   ABDOMINAL HYSTERECTOMY     AUGMENTATION MAMMAPLASTY Bilateral 1995   silicone   CATARACT EXTRACTION W/PHACO Left 07/12/2017   Procedure: CATARACT EXTRACTION PHACO AND INTRAOCULAR LENS PLACEMENT (IOC)-LEFT;  Surgeon: Nevada Crane, MD;  Location: [facility] ORS;  Service: Ophthalmology;  Laterality: Left;  Lot # [id number] H US:00:26.7 AP%: 8.0 CDE: 2.14    CATARACT EXTRACTION W/PHACO Right 08/02/2017   Procedure: CATARACT EXTRACTION PHACO AND INTRAOCULAR LENS PLACEMENT (IOC)-RIGHT PRE DIABETIC;  Surgeon: Nevada Crane, MD;  Location: [facility] ORS;  Service: Ophthalmology;  Laterality: Right;  [place] 00:19.9 AP% 12.8 CDE 2.55 [employer] lot # [id number]   CESAREAN SECTION     COLONOSCOPY     COLONOSCOPY WITH PROPOFOL N/A 04/29/2018   Procedure: COLONOSCOPY WITH PROPOFOL;  Surgeon: Christena Deem, MD;  Location: [facility] ENDOSCOPY;  Service: Endoscopy;  Laterality: N/A;   EYE SURGERY Left 06/2017   cataract extraction   TUBAL LIGATION       reports that she has quit smoking. She has never used smokeless tobacco. She reports that she does not drink alcohol and does not use drugs.  Allergies  Allergen Reactions   Alendronate     Chest  pain   Hydrochlorothiazide    Sulfa Antibiotics Nausea And Vomiting    Family History  Problem Relation Age of Onset   Cancer Mother        cervical   Cirrhosis Father    Hypertension Sister    Cancer Sister 30       cervical   Breast cancer Neg Hx     Prior to Admission medications   Medication Sig Start Date End Date Taking? Authorizing Provider  amLODipine (NORVASC) 10 MG tablet Take 10 mg by mouth daily before breakfast.    Yes [provider]  atenolol (TENORMIN) 50 MG tablet Take 50 mg by mouth daily. 04/11/22  Yes [provider]  B Complex-C (SUPER B COMPLEX PO) Take 1 capsule by mouth  daily after breakfast.   Yes [provider]  Calcium Carb-Cholecalciferol (CALCIUM 600 + D PO) Take 1 tablet by mouth daily after supper.   Yes [provider]  cholecalciferol (VITAMIN D) 1000 units tablet Take 1,000 Units by mouth daily after breakfast.   Yes [provider]  Choline Dihydrogen Citrate (CHOLINE CITRATE PO) Take 1 tablet daily after breakfast by mouth. Also contains inositol   Yes [provider]  donepezil (ARICEPT) 5 MG tablet Take 5 mg by mouth at bedtime. 04/10/22  Yes [provider]  DULoxetine (CYMBALTA) 20 MG capsule Take 20 mg by mouth daily. 05/09/22  Yes [provider]  Ginkgo Biloba 60 MG CAPS Take 60 mg by mouth 2 (two) times daily.    Yes [provider]  Glucosamine Sulfate 1000 MG CAPS Take 1,000 mg by mouth 2 (two) times daily. After breakfast and after supper.   Yes [provider]  Inositol 324 MG TABS Take by mouth.    Yes [provider]  levothyroxine (SYNTHROID) 100 MCG tablet Take by mouth. 05/03/22  Yes [provider]  lisinopril (PRINIVIL,ZESTRIL) 10 MG tablet Take 10 mg by mouth daily before breakfast.   Yes [provider]  melatonin 5 MG TABS Take 5 mg by mouth at bedtime as needed.   Yes [provider]  meloxicam (MOBIC) 15 MG tablet Take 15 mg by mouth daily after supper.    Yes [provider]  Polyvinyl Alcohol-Povidone PF (REFRESH) 1.4-0.6 % SOLN Place 1-2 drops into the right eye 3 (three) times daily as needed (for dry eyes.).    Yes [provider]  PROLIA 60 MG/ML SOSY injection Inject into the skin. 05/12/22  Yes [provider]  simvastatin (ZOCOR) 20 MG tablet Take 20 mg by mouth daily at 8 pm. 2100   Yes [provider]  atenolol (TENORMIN) 100 MG tablet Take 100 mg by mouth daily before breakfast.  Patient not taking: Reported on 06/22/2022    [provider]  Difluprednate (DUREZOL) 0.05 %  EMUL Place 1 drop into the left eye 2 (two) times daily. Into operative eye(s)  Patient not taking: Reported on 06/22/2022    [provider]  levothyroxine (SYNTHROID, LEVOTHROID) 112 MCG tablet Take 112 mcg by mouth daily before breakfast. Patient not taking: Reported on 06/22/2022    [provider]  NEOMYCIN-POLYMYXIN-HYDROCORTISONE (CORTISPORIN) 1 % SOLN OTIC solution Place 3 drops into the right ear 4 (four) times daily. Patient not taking: Reported on 06/22/2022 05/03/22   [provider]  valACYclovir (VALTREX) 1000 MG tablet Take 1,000 mg by mouth 3 (three) times daily. Patient not taking: Reported on 06/22/2022 06/16/22   [provider]    Physical Exam: Vitals:   06/22/22 1400 06/22/22 1415 06/22/22 1500 06/22/22 1515  BP: 97/65 92/60 (!) 102/57 107/64  Pulse: 91 (!) 105 83 (!) 101  Resp: 16 18 19 14   Temp:      TempSrc:      SpO2: 96% 96% 96% 96%  Weight:      Height:         Vitals:   06/22/22 1400 06/22/22 1415 06/22/22 1500 06/22/22 1515  BP: 97/65 92/60 (!) 102/57 107/64  Pulse: 91 (!) 105 83 (!) 101  Resp: 16 18 19 14   Temp:      TempSrc:      SpO2: 96% 96% 96% 96%  Weight:      Height:      Constitutional: NAD, calm, comfortable Eyes: PERRL, lids and conjunctivae normal ENMT: Mucous membranes are moist. Posterior pharynx clear of any exudate or lesions.Normal dentition.  Neck: normal, supple, no masses, no thyromegaly Respiratory: clear to auscultation bilaterally, no wheezing, no crackles. Normal respiratory effort. No accessory muscle use.  Cardiovascular: irregular rhythm, no murmurs / rubs / gallops. No extremity edema. 2+ pedal pulses.   Abdomen: no tenderness, no masses palpated. No hepatosplenomegaly. Bowel sounds positive.  Musculoskeletal: no clubbing / cyanosis. No joint deformity upper and lower extremities. Good ROM, no contractures. Normal muscle tone.  Skin: crop lesions healing on right forehead/scalp,   Neurologic: CN 2-12 grossly intact. Sensation intact, Strength 5/5 in all 4.  Psychiatric: Normal judgment and insight. Alert and oriented x 3. Normal mood.    Labs on Admission: I have personally reviewed following labs and imaging studies  CBC: Recent Labs  Lab 06/22/22 1111  WBC 11.0*  NEUTROABS 8.2*  HGB 13.9  HCT 41.0  MCV 87.8  PLT 247   Basic Metabolic Panel: Recent Labs  Lab 06/22/22 1111  NA 134*  K 4.1  CL 98  CO2 24  GLUCOSE 136*  BUN 17  CREATININE 1.21*  CALCIUM 9.0   GFR: Estimated Creatinine Clearance: 46 mL/min (A) (by C-G formula based on SCr of 1.21 mg/dL (H)). Liver Function Tests: Recent Labs  Lab 06/22/22 1111  AST 37  ALT 26  ALKPHOS 67  BILITOT 0.7  PROT 7.4  ALBUMIN 3.9   Recent Labs  Lab 06/22/22 1111  LIPASE 63*   No results for input(s): "AMMONIA" in the last 168 hours. Coagulation Profile: Recent Labs  Lab 06/22/22 1507  INR 1.1   Cardiac Enzymes: No results for input(s): "CKTOTAL", "CKMB", "CKMBINDEX", "TROPONINI" in the last 168 hours. BNP (last 3 results) No results for input(s): "PROBNP" in the last 8760 hours. HbA1C: No results for input(s): "HGBA1C" in the last 72 hours. CBG: No results for input(s): "GLUCAP" in the last 168 hours. Lipid Profile: No results for input(s): "CHOL", "HDL", "LDLCALC", "TRIG", "CHOLHDL", "LDLDIRECT" in the last 72 hours. Thyroid Function Tests: No results for input(s): "TSH", "T4TOTAL", "FREET4", "T3FREE", "THYROIDAB" in the last 72 hours. Anemia Panel: No results for input(s): "VITAMINB12", "FOLATE", "FERRITIN", "TIBC", "IRON", "RETICCTPCT" in the last 72 hours. Urine analysis: No results found for: "COLORURINE", "APPEARANCEUR", "LABSPEC", "PHURINE", "GLUCOSEU", "HGBUR", "BILIRUBINUR", "KETONESUR", "PROTEINUR", "UROBILINOGEN", "NITRITE", "LEUKOCYTESUR"  Radiological Exams on Admission: DG Chest Portable 1 View  Result Date: 06/22/2022 CLINICAL DATA:  per triage notes: Pt  complains of weakness. Nausea vomiting. Patient thinks it is from the valacyclovir that she has been taking for shingles. No fever or chills. No chest pain or shortness of breath.Weakness EXAM: PORTABLE CHEST  1 VIEW COMPARISON:  None Available. FINDINGS: Normal mediastinum and cardiac silhouette. Normal pulmonary vasculature. No evidence of effusion, infiltrate, or pneumothorax. No acute bony abnormality. IMPRESSION: No acute cardiopulmonary process. Electronically Signed   By: Suzy Bouchard M.D.   On: 06/22/2022 15:32    EKG: Independently reviewed. See above   Assessment/Plan  NSTEMI  -possible typeII related to afib however cannot r/o ACS -admit to progressive -continue heparin, asa ,statin  - patient has soft bp not able to tolerate bb at this time -echo pending  -check lipid panel    New Onset Afib  -rate controlled  -echo pending  -cardiology consult for further assistance   Hypothyroidism -on synthroid -check tsh   VZV -off valtrex ,not taking -appears improved   HLD -diet controlled  -f/u with lipid panel     HTN -holding medications due to lower bp    Depression/Anxiety -continue ssri    Anemia -stable h/h  -continue to monitor    DVT prophylaxis: heparin drip Code Status: full Family Communication: none at bedside Disposition Plan: patient  expected to be admitted greater than 2 midnights  Consults called: [facility] cardiology  Admission status: inpatient   Clance Boll MD [facility]   If 7PM-7AM, please contact night-coverage [email or web address] Password [facility]  06/22/2022, 5:23 PM

## 2022-06-22 NOTE — ED Notes (Signed)
Critical Result: Troponin 126  Message sent with same to Ellender Hose, MD

## 2022-06-22 NOTE — ED Notes (Signed)
Pt ambulates to restroom with steady gait.

## 2022-06-22 NOTE — ED Provider Notes (Signed)
[facility] Provider Note    Event Date/Time   First MD Initiated Contact with Patient 06/22/22 1307     (approximate)   History   Weakness (Nausea, vomiting , diarrhea , shingles , states general weakness)   HPI  Christina Carr is a 76 y.o. female with no significant cardiac history here with generalized weakness.  The patient recently was diagnosed with shingles.  She was started on what sounds like prednisone and/or valacyclovir.  She states that since then, she has had progressive worsening weakness, nausea, diarrhea.  Over the last 24 hours, she has had extreme fatigue, and has felt very drained.  She lives alone and normally takes care of herself fully.  She is essentially unable to even walk due to weakness today.  She has had some mild shortness of breath.  Denies any chest pain.  She had diarrhea.  No vomiting.  She does have decreased appetite.     Physical Exam   Triage Vital Signs: ED Triage Vitals  Enc Vitals Group     BP 06/22/22 1108 95/72     Pulse Rate 06/22/22 1106 85     Resp 06/22/22 1106 17     Temp 06/22/22 1108 (!) 97.5 F (36.4 C)     Temp Source 06/22/22 1108 Oral     SpO2 06/22/22 1106 93 %     Weight 06/22/22 1107 187 lb 6.3 oz (85 kg)     Height 06/22/22 1107 5\' 9"  (1.753 m)     Head Circumference --      Peak Flow --      Pain Score --      Pain Loc --      Pain Edu? --      Excl. in [place]? --     Most recent vital signs: Vitals:   06/22/22 1900 06/22/22 1946  BP: 112/79 112/69  Pulse:  99  Resp: (!) 25 16  Temp:  98.4 F (36.9 C)  SpO2: 96% 97%     General: Awake, no distress.  CV:  Good peripheral perfusion.  Mild tachycardia appreciated, irregular.  No murmurs. Resp:  Normal effort.  No murmurs. Abd:  No distention.  Other:  Mildly dry mucous membranes.   ED Results / Procedures / Treatments   Labs (all labs ordered are listed, but only abnormal results are displayed) Labs Reviewed  COMPREHENSIVE  METABOLIC PANEL - Abnormal; Notable for the following components:      Result Value   Sodium 134 (*)    Glucose, Bld 136 (*)    Creatinine, Ser 1.21 (*)    GFR, Estimated 46 (*)    All other components within normal limits  LIPASE, BLOOD - Abnormal; Notable for the following components:   Lipase 63 (*)    All other components within normal limits  CBC WITH DIFFERENTIAL/PLATELET - Abnormal; Notable for the following components:   WBC 11.0 (*)    Neutro Abs 8.2 (*)    All other components within normal limits  URINALYSIS, ROUTINE W REFLEX MICROSCOPIC - Abnormal; Notable for the following components:   Color, Urine YELLOW (*)    APPearance CLEAR (*)    All other components within normal limits  D-DIMER, QUANTITATIVE - Abnormal; Notable for the following components:   D-Dimer, Quant 1.85 (*)    All other components within normal limits  BRAIN NATRIURETIC PEPTIDE - Abnormal; Notable for the following components:   B Natriuretic Peptide 620.1 (*)  All other components within normal limits  TSH - Abnormal; Notable for the following components:   TSH 0.315 (*)    All other components within normal limits  TROPONIN I (HIGH SENSITIVITY) - Abnormal; Notable for the following components:   Troponin I (High Sensitivity) 126 (*)    All other components within normal limits  TROPONIN I (HIGH SENSITIVITY) - Abnormal; Notable for the following components:   Troponin I (High Sensitivity) 139 (*)    All other components within normal limits  TROPONIN I (HIGH SENSITIVITY) - Abnormal; Notable for the following components:   Troponin I (High Sensitivity) 168 (*)    All other components within normal limits  LACTIC ACID, PLASMA  APTT  PROTIME-INR  LIPID PANEL  CBC  HEMOGLOBIN A1C  COMPREHENSIVE METABOLIC PANEL  HEPARIN LEVEL (UNFRACTIONATED)  TROPONIN I (HIGH SENSITIVITY)     EKG Atrial fibrillation, ventricular rate 102.  QRS 76, QTc 437.  No acute ST elevations or  depressions.   RADIOLOGY  Chest x-ray: Reviewed by me, negative  I also independently reviewed and agree with radiologist interpretations.   PROCEDURES:  Critical Care performed: Yes, see critical care procedure note(s)  .Critical Care  Performed by: Shaune Pollack, MD Authorized by: Shaune Pollack, MD   Critical care provider statement:    Critical care time (minutes):  30   Critical care time was exclusive of:  Separately billable procedures and treating other patients   Critical care was necessary to treat or prevent imminent or life-threatening deterioration of the following conditions:  Cardiac failure, circulatory failure and respiratory failure   Critical care was time spent personally by me on the following activities:  Development of treatment plan with patient or surrogate, discussions with consultants, evaluation of patient's response to treatment, examination of patient, ordering and review of laboratory studies, ordering and review of radiographic studies, ordering and performing treatments and interventions, pulse oximetry, re-evaluation of patient's condition and review of old charts     MEDICATIONS ORDERED IN ED: Medications  ondansetron (ZOFRAN-ODT) disintegrating tablet 4 mg (0 mg Oral Hold 06/22/22 1311)  heparin ADULT infusion 100 units/mL (25000 units/26mL) (1,250 Units/hr Intravenous New Bag/Given 06/22/22 1633)  simvastatin (ZOCOR) tablet 40 mg (has no administration in time range)  donepezil (ARICEPT) tablet 5 mg (has no administration in time range)  DULoxetine (CYMBALTA) DR capsule 20 mg (20 mg Oral Given 06/22/22 1838)  levothyroxine (SYNTHROID) tablet 100 mcg (has no administration in time range)  0.9 %  sodium chloride infusion ( Intravenous New Bag/Given 06/22/22 1832)  acetaminophen (TYLENOL) tablet 650 mg (has no administration in time range)    Or  acetaminophen (TYLENOL) suppository 650 mg (has no administration in time range)  morphine (PF) 2  MG/ML injection 2 mg (has no administration in time range)  ondansetron (ZOFRAN) tablet 4 mg (has no administration in time range)    Or  ondansetron (ZOFRAN) injection 4 mg (has no administration in time range)  aspirin EC tablet 81 mg (has no administration in time range)  levalbuterol (XOPENEX) nebulizer solution 0.63 mg (has no administration in time range)  sodium chloride 0.9 % bolus 1,000 mL (0 mLs Intravenous Stopped 06/22/22 1622)  aspirin chewable tablet 324 mg (324 mg Oral Given 06/22/22 1526)  heparin bolus via infusion 4,000 Units (4,000 Units Intravenous Bolus from Bag 06/22/22 1633)  iohexol (OMNIPAQUE) 350 MG/ML injection 75 mL (75 mLs Intravenous Contrast Given 06/22/22 1722)     IMPRESSION / MDM / ASSESSMENT AND PLAN / ED  COURSE  I reviewed the triage vital signs and the nursing notes.                               The patient is on the cardiac monitor to evaluate for evidence of arrhythmia and/or significant heart rate changes.   Ddx:  Differential includes the following, with pertinent life- or limb-threatening emergencies considered:  Medication adverse effect, dehydration, AKI, anemia, demand ischemia, CHF, ACS, AFib/arrhythmia  Patient's presentation is most consistent with acute presentation with potential threat to life or bodily function.  MDM:  76 yo F here with generalized weakness in setting of valtrex, prednisone use for shingles. Pt shingles appears to be healing - no signs of bacterial superinfection. Pt does appear to be in new onset AFib, however, and trop in 100s - concerning for demand. Denies any active CP and pt has no ST elevations.CMP unremarkable. CBC with minimal leukocytosis no anemia. Will admit for ACS, NSTEMI vs symptomatic AFib RVR with demand.    MEDICATIONS GIVEN IN ED: Medications  ondansetron (ZOFRAN-ODT) disintegrating tablet 4 mg (0 mg Oral Hold 06/22/22 1311)  heparin ADULT infusion 100 units/mL (25000 units/223mL) (1,250 Units/hr  Intravenous New Bag/Given 06/22/22 1633)  simvastatin (ZOCOR) tablet 40 mg (has no administration in time range)  donepezil (ARICEPT) tablet 5 mg (has no administration in time range)  DULoxetine (CYMBALTA) DR capsule 20 mg (20 mg Oral Given 06/22/22 1838)  levothyroxine (SYNTHROID) tablet 100 mcg (has no administration in time range)  0.9 %  sodium chloride infusion ( Intravenous New Bag/Given 06/22/22 1832)  acetaminophen (TYLENOL) tablet 650 mg (has no administration in time range)    Or  acetaminophen (TYLENOL) suppository 650 mg (has no administration in time range)  morphine (PF) 2 MG/ML injection 2 mg (has no administration in time range)  ondansetron (ZOFRAN) tablet 4 mg (has no administration in time range)    Or  ondansetron (ZOFRAN) injection 4 mg (has no administration in time range)  aspirin EC tablet 81 mg (has no administration in time range)  levalbuterol (XOPENEX) nebulizer solution 0.63 mg (has no administration in time range)  sodium chloride 0.9 % bolus 1,000 mL (0 mLs Intravenous Stopped 06/22/22 1622)  aspirin chewable tablet 324 mg (324 mg Oral Given 06/22/22 1526)  heparin bolus via infusion 4,000 Units (4,000 Units Intravenous Bolus from Bag 06/22/22 1633)  iohexol (OMNIPAQUE) 350 MG/ML injection 75 mL (75 mLs Intravenous Contrast Given 06/22/22 1722)     Consults:  Hospitalist   EMR reviewed       FINAL CLINICAL IMPRESSION(S) / ED DIAGNOSES   Final diagnoses:  New onset atrial fibrillation (HCC)  Elevated troponin     Rx / DC Orders   ED Discharge Orders          Ordered    Amb referral to AFIB Clinic        06/22/22 1810             Note:  This document was prepared using Dragon voice recognition software and may include unintentional dictation errors.   Shaune Pollack, MD 06/22/22 2000

## 2022-06-22 NOTE — Consult Note (Signed)
ANTICOAGULATION CONSULT NOTE - Consult  Pharmacy Consult for heparin gtt Indication: atrial fibrillation  Allergies  Allergen Reactions   Alendronate     Chest pain   Hydrochlorothiazide    Patient Measurements: Height: 5\' 9"  (175.3 cm) Weight: 85 kg (187 lb 6.3 oz) IBW/kg (Calculated) : 66.2 Heparin Dosing Weight: 83.4kg  Vital Signs: Temp: 97.5 F (36.4 C) (09/28 1108) Temp Source: Oral (09/28 1108) BP: 92/60 (09/28 1415) Pulse Rate: 105 (09/28 1415)  Labs: Recent Labs    06/22/22 1111 06/22/22 1342  HGB 13.9  --   HCT 41.0  --   PLT 247  --   CREATININE 1.21*  --   TROPONINIHS 126* 139*    Estimated Creatinine Clearance: 46 mL/min (A) (by C-G formula based on SCr of 1.21 mg/dL (H)).   Medications:  Heparin Dosing Weight: 83.4kg; No AC/APT pertinent allergies. PTA: Inpatient:  Assessment:  76 y.o. female with no significant cardiac history here with generalized weakness. Pt recently was diagnosed with shingles & started prednisone and valacyclovir but experience progressive worsening of weakness a/w nausea & diarrhea. Pharmacy consulted for the management of heparin gtt ISO Afib w/ RVR.  Date Time aPTT/HL Rate/Comment       Baseline Labs: aPTT - pending; INR - pending Hgb - 13.9; Plts - 247  Goal of Therapy:  Heparin level 0.3-0.7 units/ml Monitor platelets by anticoagulation protocol: Yes   Plan:  Give 4000 units bolus x1; then start heparin infusion at 1250 units/hr Check anti-Xa level in 8 hours and then daily once consecutively therapeutic. Continue to monitor H&H and platelets daily while on heparin gtt.  Shanon Brow Della Scrivener 06/22/2022,3:43 PM

## 2022-06-23 ENCOUNTER — Other Ambulatory Visit: Payer: Self-pay

## 2022-06-23 ENCOUNTER — Inpatient Hospital Stay
Admit: 2022-06-23 | Discharge: 2022-06-23 | Disposition: A | Discharge status: Home / Self Care | Payer: Medicare PPO | Attending: Internal Medicine | Admitting: Internal Medicine

## 2022-06-23 DIAGNOSIS — I4891 Unspecified atrial fibrillation: Secondary | ICD-10-CM | POA: Diagnosis not present

## 2022-06-23 LAB — COMPREHENSIVE METABOLIC PANEL
ALT: 24 U/L (ref 0–44)
AST: 29 U/L (ref 15–41)
Albumin: 3.1 g/dL — ABNORMAL LOW (ref 3.5–5.0)
Alkaline Phosphatase: 55 U/L (ref 38–126)
Anion gap: 6 (ref 5–15)
BUN: 14 mg/dL (ref 8–23)
CO2: 22 mmol/L (ref 22–32)
Calcium: 7.9 mg/dL — ABNORMAL LOW (ref 8.9–10.3)
Chloride: 106 mmol/L (ref 98–111)
Creatinine, Ser: 0.81 mg/dL (ref 0.44–1.00)
GFR, Estimated: >60 mL/min (ref >60)
Glucose, Bld: 109 mg/dL — ABNORMAL HIGH (ref 70–99)
Potassium: 3.8 mmol/L (ref 3.5–5.1)
Sodium: 134 mmol/L — ABNORMAL LOW (ref 135–145)
Total Bilirubin: 0.6 mg/dL (ref 0.3–1.2)
Total Protein: 6 g/dL — ABNORMAL LOW (ref 6.5–8.1)

## 2022-06-23 LAB — ECHOCARDIOGRAM COMPLETE
AR max vel: 1.73 cm2
AV Area VTI: 1.59 cm2
AV Area mean vel: 1.59 cm2
AV Mean grad: 4 mmHg
AV Peak grad: 6.6 mmHg
Ao pk vel: 1.28 m/s
Area-P 1/2: 2.97 cm2
Height: 69 in
S' Lateral: 2.4 cm
Weight: 2998.26 oz

## 2022-06-23 LAB — CBC
HCT: 35.1 % — ABNORMAL LOW (ref 36.0–46.0)
Hemoglobin: 11.9 g/dL — ABNORMAL LOW (ref 12.0–15.0)
MCH: 29.3 pg (ref 26.0–34.0)
MCHC: 33.9 g/dL (ref 30.0–36.0)
MCV: 86.5 fL (ref 80.0–100.0)
Platelets: 229 10*3/uL (ref 150–400)
RBC: 4.06 MIL/uL (ref 3.87–5.11)
RDW: 12.2 % (ref 11.5–15.5)
WBC: 9 10*3/uL (ref 4.0–10.5)
nRBC: 0 % (ref 0.0–0.2)

## 2022-06-23 LAB — HEPARIN LEVEL (UNFRACTIONATED)
Heparin Unfractionated: 0.59 IU/mL (ref 0.30–0.70)
Heparin Unfractionated: 0.66 IU/mL (ref 0.30–0.70)

## 2022-06-23 LAB — TROPONIN I (HIGH SENSITIVITY): Troponin I (High Sensitivity): 126 ng/L (ref <18)

## 2022-06-23 LAB — HEMOGLOBIN A1C
Hgb A1c MFr Bld: 5.4 % (ref 4.8–5.6)
Mean Plasma Glucose: 108.28 mg/dL

## 2022-06-23 MED ORDER — METOPROLOL TARTRATE 25 MG PO TABS: 25.0000 mg | ORAL_TABLET | Freq: Two times a day (BID) | ORAL | 2 refills | Status: DC

## 2022-06-23 MED ORDER — APIXABAN 5 MG PO TABS
5.0000 mg | ORAL_TABLET | Freq: Two times a day (BID) | ORAL | Status: DC
  Administered 2022-06-23: 5 mg via ORAL
  Filled 2022-06-23: qty 1

## 2022-06-23 MED ORDER — DILTIAZEM HCL 25 MG/5ML IV SOLN
5.0000 mg | Freq: Once | INTRAVENOUS | Status: AC
  Administered 2022-06-23: 5 mg via INTRAVENOUS
  Filled 2022-06-23: qty 5

## 2022-06-23 MED ORDER — APIXABAN 5 MG PO TABS: 5.0000 mg | ORAL_TABLET | Freq: Two times a day (BID) | ORAL | 2 refills | Status: AC

## 2022-06-23 MED ORDER — METOPROLOL TARTRATE 25 MG PO TABS
25.0000 mg | ORAL_TABLET | Freq: Two times a day (BID) | ORAL | Status: DC
  Administered 2022-06-23: 25 mg via ORAL
  Filled 2022-06-23: qty 1

## 2022-06-23 NOTE — Progress Notes (Signed)
*  PRELIMINARY RESULTS* Echocardiogram 2D Echocardiogram has been performed.  Christina Carr 06/23/2022, 10:01 AM

## 2022-06-23 NOTE — Consult Note (Signed)
ANTICOAGULATION CONSULT NOTE  Pharmacy Consult for heparin gtt Indication: atrial fibrillation  Allergies  Allergen Reactions   Alendronate     Chest pain   Hydrochlorothiazide    Sulfa Antibiotics Nausea And Vomiting   Patient Measurements: Height: 5\' 9"  (175.3 cm) Weight: 85 kg (187 lb 6.3 oz) IBW/kg (Calculated) : 66.2 Heparin Dosing Weight: 83.4kg  Vital Signs: Temp: 98.3 F (36.8 C) (09/29 0511) Temp Source: Oral (09/29 0022) BP: 103/67 (09/29 0700) Pulse Rate: 102 (09/29 0700)  Labs: Recent Labs    06/22/22 1111 06/22/22 1342 06/22/22 1507 06/22/22 1829 06/23/22 0013 06/23/22 0513  HGB 13.9  --   --   --   --  11.9*  HCT 41.0  --   --   --   --  35.1*  PLT 247  --   --   --   --  229  APTT  --   --  32  --   --   --   LABPROT  --   --  14.2  --   --   --   INR  --   --  1.1  --   --   --   HEPARINUNFRC  --   --   --   --  0.66 0.59  CREATININE 1.21*  --   --   --   --   --   TROPONINIHS 126* 139*  --  168*  --   --      Estimated Creatinine Clearance: 46 mL/min (A) (by C-G formula based on SCr of 1.21 mg/dL (H)).  Assessment:  76 y.o. female with no significant cardiac history here with generalized weakness. Pt recently was diagnosed with shingles & started prednisone and valacyclovir but experience progressive worsening of weakness a/w nausea & diarrhea. Pharmacy consulted for the management of heparin gtt ISO Afib w/ RVR.   Goal of Therapy:  Heparin level 0.3-0.7 units/ml Monitor platelets by anticoagulation protocol: Yes   Plan: heparin level therapeutic X 2  --continue heparin at 1250 units/hr --check heparin level once daily while on IV heparin --CBC once daily while on IV heparin  Dallie Piles 06/23/2022,7:10 AM

## 2022-06-23 NOTE — Progress Notes (Signed)
Patient in Afib HR in 130's MD made aware. Orders given for 5mg  IV diltiazem and PO lopressor.

## 2022-06-23 NOTE — Care Management CC44 (Signed)
Condition Code 44 Documentation Completed  Patient Details  Name: LAKEA MITTELMAN MRN: [id number] Date of Birth: December 25, 1945   Condition Code 44 given:  Yes Patient signature on Condition Code 44 notice:  Yes Documentation of 2 MD's agreement:  Yes Code 44 added to claim:  Yes    Shelbie Hutching, RN 06/23/2022, 9:36 AM

## 2022-06-23 NOTE — Consult Note (Signed)
ANTICOAGULATION CONSULT NOTE - Consult  Pharmacy Consult for heparin gtt Indication: atrial fibrillation  Allergies  Allergen Reactions   Alendronate     Chest pain   Hydrochlorothiazide    Sulfa Antibiotics Nausea And Vomiting   Patient Measurements: Height: 5\' 9"  (175.3 cm) Weight: 85 kg (187 lb 6.3 oz) IBW/kg (Calculated) : 66.2 Heparin Dosing Weight: 83.4kg  Vital Signs: Temp: 98.8 F (37.1 C) (09/29 0022) Temp Source: Oral (09/29 0022) BP: 134/68 (09/29 0022) Pulse Rate: 96 (09/29 0022)  Labs: Recent Labs    06/22/22 1111 06/22/22 1342 06/22/22 1507 06/22/22 1829 06/23/22 0013  HGB 13.9  --   --   --   --   HCT 41.0  --   --   --   --   PLT 247  --   --   --   --   APTT  --   --  32  --   --   LABPROT  --   --  14.2  --   --   INR  --   --  1.1  --   --   HEPARINUNFRC  --   --   --   --  0.66  CREATININE 1.21*  --   --   --   --   TROPONINIHS 126* 139*  --  168*  --      Estimated Creatinine Clearance: 46 mL/min (A) (by C-G formula based on SCr of 1.21 mg/dL (H)).   Medications:  Heparin Dosing Weight: 83.4kg; No AC/APT pertinent allergies. PTA: Inpatient:  Assessment:  76 y.o. female with no significant cardiac history here with generalized weakness. Pt recently was diagnosed with shingles & started prednisone and valacyclovir but experience progressive worsening of weakness a/w nausea & diarrhea. Pharmacy consulted for the management of heparin gtt ISO Afib w/ RVR.  Date Time aPTT/HL Rate/Comment       Baseline Labs: aPTT - pending; INR - pending Hgb - 13.9; Plts - 247  Goal of Therapy:  Heparin level 0.3-0.7 units/ml Monitor platelets by anticoagulation protocol: Yes   Plan:  9/29:  HL @ 0013 = 0.66, therapeutic X 1  Will continue pt on current rate and recheck HL in 8 hrs on 9/29 @ 0800.   Daejah Klebba D 06/23/2022,1:06 AM

## 2022-06-23 NOTE — Care Management Obs Status (Signed)
[facility] NOTIFICATION   Patient Details  Name: Christina Carr MRN: [id number] Date of Birth: 09/18/1946   Medicare Observation Status Notification Given:  Yes    Shelbie Hutching, RN 06/23/2022, 9:36 AM

## 2022-06-23 NOTE — Progress Notes (Signed)
Patient ambulating with standby assistance to and from bathroom, tolerated well. No acute distress. IVs removed from patient. Discharge instructions given to patient, verbalized understanding. Patient to call for ride home.

## 2022-06-23 NOTE — Progress Notes (Signed)
   06/23/22 1014  Vitals  BP 115/81  MAP (mmHg) 92  Pulse Rate 96  ECG Heart Rate (!) 111  Resp (!) 21  MEWS COLOR  MEWS Score Color Yellow  Oxygen Therapy  SpO2 94 %  MEWS Score  MEWS Temp 0  MEWS Systolic 0  MEWS Pulse 2  MEWS RR 1  MEWS LOC 0  MEWS Score 3   Vitals 1 hour post medication administration.

## 2022-06-23 NOTE — Discharge Summary (Signed)
Physician Discharge Summary   Christina Carr  female DOB: [birth year]  MWU:[id number]  PCP: Tracie Harrier, MD  Admit date: 06/22/2022 Discharge date: 06/23/2022  Admitted From: home Disposition:  home CODE STATUS: Full code  Discharge Instructions     Amb referral to AFIB [facility]   Complete by: As directed       Hospital Course:  For full details, please see H&P, progress notes, consult notes and ancillary notes.  Briefly,  Christina Carr is a 76 y.o. female with medical history significant of  Hypothyrodism, HLD, HTN, COPD, who presented to ED with complaint of n/v severe weakness and near syncope as well as abnormal heart rhythm.  New Onset Afib  --no persistent RVR --discussed with cardio Dr. Humphrey Rolls, who rec discharge on Lopressor 25 mg BID and see him in clinic later in the same day. --Start Eliquis for primary stroke prevention.   HTN --resume home Lisinopril after discharge. --start Lopressor (new), and d/c home atenolol --d/c home amlodipine since BP was intermittently low  Trop elevation due to demand ischemia NSTEMI, ruled out --started on heparin gtt on presentation, however, trop 100's flat x4, so heparin gtt d/c'ed.   Hypothyroidism -cont synthroid   Recent VZV -off valtrex    HLD -diet controlled     Depression/Anxiety -continue Cymbalta     Discharge Diagnoses:  Principal Problem:   A-fib ([place])   30 Day Unplanned Readmission Risk Score    Flowsheet Row ED from 06/22/2022 in [facility]  30 Day Unplanned Readmission Risk Score (%) 9.13 Filed at 06/23/2022 0801       This score is the patient's risk of an unplanned readmission within 30 days of being discharged (0 -100%). The score is based on dignosis, age, lab data, medications, orders, and past utilization.   Low:  0-14.9   Medium: 15-21.9   High: 22-29.9   Extreme: 30 and above         Discharge Instructions:  Allergies as of  06/23/2022       Reactions   Alendronate    Chest pain   Hydrochlorothiazide    Sulfa Antibiotics Nausea And Vomiting        Medication List     STOP taking these medications    amLODipine 10 MG tablet Commonly known as: NORVASC   atenolol 50 MG tablet Commonly known as: TENORMIN   meloxicam 15 MG tablet Commonly known as: MOBIC       TAKE these medications    apixaban 5 MG Tabs tablet Commonly known as: ELIQUIS Take 1 tablet (5 mg total) by mouth 2 (two) times daily.   CALCIUM 600 + D PO Take 1 tablet by mouth daily after supper.   cholecalciferol 1000 units tablet Commonly known as: VITAMIN D Take 1,000 Units by mouth daily after breakfast.   CHOLINE CITRATE PO Take 1 tablet daily after breakfast by mouth. Also contains inositol   donepezil 5 MG tablet Commonly known as: ARICEPT Take 5 mg by mouth at bedtime.   DULoxetine 20 MG capsule Commonly known as: CYMBALTA Take 20 mg by mouth daily.   Ginkgo Biloba 60 MG Caps Take 60 mg by mouth 2 (two) times daily.   Glucosamine Sulfate 1000 MG Caps Take 1,000 mg by mouth 2 (two) times daily. After breakfast and after supper.   Inositol 324 MG Tabs Take by mouth.   levothyroxine 100 MCG tablet Commonly known as: SYNTHROID Take by mouth.  lisinopril 10 MG tablet Commonly known as: ZESTRIL Take 10 mg by mouth daily before breakfast.   melatonin 5 MG Tabs Take 5 mg by mouth at bedtime as needed.   metoprolol tartrate 25 MG tablet Commonly known as: LOPRESSOR Take 1 tablet (25 mg total) by mouth 2 (two) times daily.   Prolia 60 MG/ML Sosy injection Generic drug: denosumab Inject into the skin.   Refresh 1.4-0.6 % Soln Generic drug: Polyvinyl Alcohol-Povidone PF Place 1-2 drops into the right eye 3 (three) times daily as needed (for dry eyes.).   simvastatin 20 MG tablet Commonly known as: ZOCOR Take 20 mg by mouth daily at 8 pm. 2100   SUPER B COMPLEX PO Take 1 capsule by mouth daily after  breakfast.         Follow-up Information     Laurier Nancy, MD Follow up today.   Specialty: Cardiology Why: today at 1 pm. Contact information: [address] [place] [address] [phone]         Barbette Reichmann, MD Follow up in 1 week(s).   Specialty: Internal Medicine Contact information: [address] [place] [address] [phone]                 Allergies  Allergen Reactions   Alendronate     Chest pain   Hydrochlorothiazide    Sulfa Antibiotics Nausea And Vomiting     The results of significant diagnostics from this hospitalization (including imaging, microbiology, ancillary and laboratory) are listed below for reference.   Consultations:   Procedures/Studies: CT Angio Chest PE W and/or Wo Contrast  Result Date: 06/22/2022 CLINICAL DATA:  Weakness. EXAM: CT ANGIOGRAPHY CHEST WITH CONTRAST TECHNIQUE: Multidetector CT imaging of the chest was performed using the standard protocol during bolus administration of intravenous contrast. Multiplanar CT image reconstructions and MIPs were obtained to evaluate the vascular anatomy. RADIATION DOSE REDUCTION: This exam was performed according to the departmental dose-optimization program which includes automated exposure control, adjustment of the mA and/or kV according to patient size and/or use of iterative reconstruction technique. CONTRAST:  2mL OMNIPAQUE IOHEXOL 350 MG/ML SOLN COMPARISON:  May 22, 2022. FINDINGS: Cardiovascular: Satisfactory opacification of the pulmonary arteries to the segmental level. No evidence of pulmonary embolism. Normal heart size. No pericardial effusion. Mediastinum/Nodes: No enlarged mediastinal, hilar, or axillary lymph nodes. Thyroid gland, trachea, and esophagus demonstrate no significant findings. Lungs/Pleura: No pneumothorax or pleural effusion is noted. Emphysematous disease is noted bilaterally. No acute pulmonary disease is  noted. Upper Abdomen: No acute abnormality. Musculoskeletal: No chest wall abnormality. No acute or significant osseous findings. Review of the MIP images confirms the above findings. IMPRESSION: No definite evidence of pulmonary embolus. Aortic Atherosclerosis (ICD10-I70.0) and Emphysema (ICD10-J43.9). Electronically Signed   By: Lupita Raider M.D.   On: 06/22/2022 17:38   DG Chest Portable 1 View  Result Date: 06/22/2022 CLINICAL DATA:  per triage notes: Pt complains of weakness. Nausea vomiting. Patient thinks it is from the valacyclovir that she has been taking for shingles. No fever or chills. No chest pain or shortness of breath.Weakness EXAM: PORTABLE CHEST 1 VIEW COMPARISON:  None Available. FINDINGS: Normal mediastinum and cardiac silhouette. Normal pulmonary vasculature. No evidence of effusion, infiltrate, or pneumothorax. No acute bony abnormality. IMPRESSION: No acute cardiopulmonary process. Electronically Signed   By: Genevive Bi M.D.   On: 06/22/2022 15:32      Labs: BNP (last 3 results) Recent Labs    06/22/22 1111  BNP 620.1*   Basic Metabolic Panel: Recent Labs  Lab 06/22/22 1111 06/23/22 0513  NA 134* 134*  K 4.1 3.8  CL 98 106  CO2 24 22  GLUCOSE 136* 109*  BUN 17 14  CREATININE 1.21* 0.81  CALCIUM 9.0 7.9*   Liver Function Tests: Recent Labs  Lab 06/22/22 1111 06/23/22 0513  AST 37 29  ALT 26 24  ALKPHOS 67 55  BILITOT 0.7 0.6  PROT 7.4 6.0*  ALBUMIN 3.9 3.1*   Recent Labs  Lab 06/22/22 1111  LIPASE 63*   No results for input(s): "AMMONIA" in the last 168 hours. CBC: Recent Labs  Lab 06/22/22 1111 06/23/22 0513  WBC 11.0* 9.0  NEUTROABS 8.2*  --   HGB 13.9 11.9*  HCT 41.0 35.1*  MCV 87.8 86.5  PLT 247 229   Cardiac Enzymes: No results for input(s): "CKTOTAL", "CKMB", "CKMBINDEX", "TROPONINI" in the last 168 hours. BNP: Invalid input(s): "POCBNP" CBG: No results for input(s): "GLUCAP" in the last 168 hours. D-Dimer Recent  Labs    06/22/22 1546  DDIMER 1.85*   Hgb A1c Recent Labs    06/22/22 1829  HGBA1C 5.4   Lipid Profile Recent Labs    06/22/22 1829  CHOL 148  HDL 45  LDLCALC 84  TRIG 95  CHOLHDL 3.3   Thyroid function studies Recent Labs    06/22/22 1829  TSH 0.315*   Anemia work up No results for input(s): "VITAMINB12", "FOLATE", "FERRITIN", "TIBC", "IRON", "RETICCTPCT" in the last 72 hours. Urinalysis    Component Value Date/Time   COLORURINE YELLOW (A) 06/22/2022 1758   APPEARANCEUR CLEAR (A) 06/22/2022 1758   LABSPEC 1.018 06/22/2022 1758   PHURINE 5.0 06/22/2022 1758   GLUCOSEU NEGATIVE 06/22/2022 1758   HGBUR NEGATIVE 06/22/2022 1758   BILIRUBINUR NEGATIVE 06/22/2022 1758   KETONESUR NEGATIVE 06/22/2022 1758   PROTEINUR NEGATIVE 06/22/2022 1758   NITRITE NEGATIVE 06/22/2022 1758   LEUKOCYTESUR NEGATIVE 06/22/2022 1758   Sepsis Labs Recent Labs  Lab 06/22/22 1111 06/23/22 0513  WBC 11.0* 9.0   Microbiology No results found for this or any previous visit (from the past 240 hour(s)).   Total time spend on discharging this patient, including the last patient exam, discussing the hospital stay, instructions for ongoing care as it relates to all pertinent caregivers, as well as preparing the medical discharge records, prescriptions, and/or referrals as applicable, is 50 minutes.    Darlin Priestly, MD  [facility] 06/23/2022, 9:14 AM

## 2022-09-09 ENCOUNTER — Inpatient Hospital Stay
Admission: EM | Admit: 2022-09-09 | Discharge: 2022-09-12 | DRG: 291 | Disposition: A | Discharge status: Home / Self Care | Payer: Medicare PPO | Attending: Internal Medicine | Admitting: Internal Medicine

## 2022-09-09 ENCOUNTER — Emergency Department: Payer: Medicare PPO

## 2022-09-09 DIAGNOSIS — E440 Moderate protein-calorie malnutrition: Secondary | ICD-10-CM | POA: Diagnosis present

## 2022-09-09 DIAGNOSIS — Z882 Allergy status to sulfonamides status: Secondary | ICD-10-CM | POA: Diagnosis not present

## 2022-09-09 DIAGNOSIS — I4891 Unspecified atrial fibrillation: Secondary | ICD-10-CM | POA: Diagnosis present

## 2022-09-09 DIAGNOSIS — Z8049 Family history of malignant neoplasm of other genital organs: Secondary | ICD-10-CM | POA: Diagnosis not present

## 2022-09-09 DIAGNOSIS — Z888 Allergy status to other drugs, medicaments and biological substances status: Secondary | ICD-10-CM | POA: Diagnosis not present

## 2022-09-09 DIAGNOSIS — Z7901 Long term (current) use of anticoagulants: Secondary | ICD-10-CM | POA: Diagnosis not present

## 2022-09-09 DIAGNOSIS — Z6824 Body mass index (BMI) 24.0-24.9, adult: Secondary | ICD-10-CM | POA: Diagnosis not present

## 2022-09-09 DIAGNOSIS — I10 Essential (primary) hypertension: Secondary | ICD-10-CM | POA: Diagnosis present

## 2022-09-09 DIAGNOSIS — E7800 Pure hypercholesterolemia, unspecified: Secondary | ICD-10-CM | POA: Diagnosis present

## 2022-09-09 DIAGNOSIS — I5031 Acute diastolic (congestive) heart failure: Secondary | ICD-10-CM | POA: Diagnosis present

## 2022-09-09 DIAGNOSIS — Z8249 Family history of ischemic heart disease and other diseases of the circulatory system: Secondary | ICD-10-CM | POA: Diagnosis not present

## 2022-09-09 DIAGNOSIS — Z87891 Personal history of nicotine dependence: Secondary | ICD-10-CM | POA: Diagnosis not present

## 2022-09-09 DIAGNOSIS — M810 Age-related osteoporosis without current pathological fracture: Secondary | ICD-10-CM | POA: Diagnosis present

## 2022-09-09 DIAGNOSIS — J449 Chronic obstructive pulmonary disease, unspecified: Secondary | ICD-10-CM | POA: Diagnosis not present

## 2022-09-09 DIAGNOSIS — I480 Paroxysmal atrial fibrillation: Secondary | ICD-10-CM | POA: Diagnosis present

## 2022-09-09 DIAGNOSIS — Z79890 Hormone replacement therapy (postmenopausal): Secondary | ICD-10-CM | POA: Diagnosis not present

## 2022-09-09 DIAGNOSIS — E039 Hypothyroidism, unspecified: Secondary | ICD-10-CM | POA: Diagnosis present

## 2022-09-09 DIAGNOSIS — J9621 Acute and chronic respiratory failure with hypoxia: Secondary | ICD-10-CM | POA: Diagnosis present

## 2022-09-09 DIAGNOSIS — J439 Emphysema, unspecified: Secondary | ICD-10-CM | POA: Diagnosis present

## 2022-09-09 DIAGNOSIS — I5033 Acute on chronic diastolic (congestive) heart failure: Secondary | ICD-10-CM | POA: Diagnosis present

## 2022-09-09 DIAGNOSIS — I482 Chronic atrial fibrillation, unspecified: Secondary | ICD-10-CM | POA: Diagnosis not present

## 2022-09-09 DIAGNOSIS — Z79899 Other long term (current) drug therapy: Secondary | ICD-10-CM | POA: Diagnosis not present

## 2022-09-09 DIAGNOSIS — F32A Depression, unspecified: Secondary | ICD-10-CM | POA: Diagnosis present

## 2022-09-09 DIAGNOSIS — I110 Hypertensive heart disease with heart failure: Principal | ICD-10-CM | POA: Diagnosis present

## 2022-09-09 DIAGNOSIS — Z1152 Encounter for screening for COVID-19: Secondary | ICD-10-CM | POA: Diagnosis not present

## 2022-09-09 DIAGNOSIS — J9601 Acute respiratory failure with hypoxia: Secondary | ICD-10-CM | POA: Insufficient documentation

## 2022-09-09 DIAGNOSIS — R7303 Prediabetes: Secondary | ICD-10-CM | POA: Diagnosis present

## 2022-09-09 DIAGNOSIS — Z789 Other specified health status: Secondary | ICD-10-CM | POA: Insufficient documentation

## 2022-09-09 LAB — COMPREHENSIVE METABOLIC PANEL
ALT: 47 U/L — ABNORMAL HIGH (ref 0–44)
AST: 44 U/L — ABNORMAL HIGH (ref 15–41)
Albumin: 3.8 g/dL (ref 3.5–5.0)
Alkaline Phosphatase: 55 U/L (ref 38–126)
Anion gap: 8 (ref 5–15)
BUN: 19 mg/dL (ref 8–23)
CO2: 23 mmol/L (ref 22–32)
Calcium: 8.7 mg/dL — ABNORMAL LOW (ref 8.9–10.3)
Chloride: 105 mmol/L (ref 98–111)
Creatinine, Ser: 0.79 mg/dL (ref 0.44–1.00)
GFR, Estimated: >60 mL/min (ref >60)
Glucose, Bld: 166 mg/dL — ABNORMAL HIGH (ref 70–99)
Potassium: 4.5 mmol/L (ref 3.5–5.1)
Sodium: 136 mmol/L (ref 135–145)
Total Bilirubin: 0.8 mg/dL (ref 0.3–1.2)
Total Protein: 6.7 g/dL (ref 6.5–8.1)

## 2022-09-09 LAB — CBC WITH DIFFERENTIAL/PLATELET
Abs Immature Granulocytes: 0.03 10*3/uL (ref 0.00–0.07)
Basophils Absolute: 0.1 10*3/uL (ref 0.0–0.1)
Basophils Relative: 1 %
Eosinophils Absolute: 0 10*3/uL (ref 0.0–0.5)
Eosinophils Relative: 0 %
HCT: 43.2 % (ref 36.0–46.0)
Hemoglobin: 13.8 g/dL (ref 12.0–15.0)
Immature Granulocytes: 0 %
Lymphocytes Relative: 36 %
Lymphs Abs: 3.7 10*3/uL (ref 0.7–4.0)
MCH: 29.2 pg (ref 26.0–34.0)
MCHC: 31.9 g/dL (ref 30.0–36.0)
MCV: 91.3 fL (ref 80.0–100.0)
Monocytes Absolute: 0.6 10*3/uL (ref 0.1–1.0)
Monocytes Relative: 6 %
Neutro Abs: 6 10*3/uL (ref 1.7–7.7)
Neutrophils Relative %: 57 %
Platelets: 275 10*3/uL (ref 150–400)
RBC: 4.73 MIL/uL (ref 3.87–5.11)
RDW: 12.6 % (ref 11.5–15.5)
WBC: 10.5 10*3/uL (ref 4.0–10.5)
nRBC: 0 % (ref 0.0–0.2)

## 2022-09-09 LAB — BRAIN NATRIURETIC PEPTIDE: B Natriuretic Peptide: 523 pg/mL — ABNORMAL HIGH (ref 0.0–100.0)

## 2022-09-09 LAB — MAGNESIUM: Magnesium: 2 mg/dL (ref 1.7–2.4)

## 2022-09-09 LAB — TROPONIN I (HIGH SENSITIVITY): Troponin I (High Sensitivity): 13 ng/L (ref <18)

## 2022-09-09 MED ORDER — SIMVASTATIN 20 MG PO TABS
20.0000 mg | ORAL_TABLET | Freq: Every day | ORAL | Status: DC
  Administered 2022-09-10 – 2022-09-11 (×2): 20 mg via ORAL
  Filled 2022-09-09 (×2): qty 1

## 2022-09-09 MED ORDER — DILTIAZEM HCL 25 MG/5ML IV SOLN
20.0000 mg | Freq: Once | INTRAVENOUS | Status: AC
  Administered 2022-09-09: 20 mg via INTRAVENOUS
  Filled 2022-09-09: qty 5

## 2022-09-09 MED ORDER — APIXABAN 5 MG PO TABS
5.0000 mg | ORAL_TABLET | Freq: Two times a day (BID) | ORAL | Status: DC
  Administered 2022-09-10 – 2022-09-12 (×5): 5 mg via ORAL
  Filled 2022-09-09 (×5): qty 1

## 2022-09-09 MED ORDER — LEVOTHYROXINE SODIUM 88 MCG PO TABS
88.0000 ug | ORAL_TABLET | Freq: Every day | ORAL | Status: DC
  Administered 2022-09-10 – 2022-09-12 (×3): 88 ug via ORAL
  Filled 2022-09-09 (×4): qty 1

## 2022-09-09 MED ORDER — DILTIAZEM HCL-DEXTROSE 125-5 MG/125ML-% IV SOLN (PREMIX)
5.0000 mg/h | INTRAVENOUS | Status: DC
  Administered 2022-09-09: 5 mg/h via INTRAVENOUS
  Filled 2022-09-09: qty 125

## 2022-09-09 MED ORDER — LISINOPRIL 10 MG PO TABS: 10.0000 mg | ORAL_TABLET | Freq: Every day | ORAL | Status: DC

## 2022-09-09 MED ORDER — DULOXETINE HCL 20 MG PO CPEP
20.0000 mg | ORAL_CAPSULE | Freq: Every day | ORAL | Status: DC
  Administered 2022-09-10 – 2022-09-12 (×3): 20 mg via ORAL
  Filled 2022-09-09 (×3): qty 1

## 2022-09-09 MED ORDER — DONEPEZIL HCL 5 MG PO TABS
5.0000 mg | ORAL_TABLET | Freq: Every day | ORAL | Status: DC
  Administered 2022-09-10 – 2022-09-11 (×2): 5 mg via ORAL
  Filled 2022-09-09 (×3): qty 1

## 2022-09-09 NOTE — ED Provider Notes (Signed)
[facility] Provider Note    Event Date/Time   First MD Initiated Contact with Patient 09/09/22 2209     (approximate)   History   Shortness of Breath, Tachycardia, and Palpitations   HPI  Christina Carr is a 76 y.o. female with history of hypertension, COPD, A-fib who comes in with concerns for elevated heart rates.  Patient reports having elevated heart rates over the past week.  She is trying to hold out for seeing her cardiologist but they got worse today and she was having some nausea and diaphoresis and heart rates were noted over 200 so she called EMS.  EMS gave 10 of diltiazem and heart rates have come down to 140.  She reports being chronically in A-fib.  Patient was admitted previously back in September for new onset A-fib and placed on Lopressor 25 mg twice daily.  She reports being compliant with her Eliquis.  She denies any falls, hitting her head.  She denies any abdominal pain   Physical Exam   Triage Vital Signs: ED Triage Vitals  Enc Vitals Group     BP      Pulse      Resp      Temp      Temp src      SpO2      Weight      Height      Head Circumference      Peak Flow      Pain Score      Pain Loc      Pain Edu?      Excl. in GC?     Most recent vital signs: There were no vitals filed for this visit.   General: Awake, no distress.  CV:  Good peripheral perfusion.  Irregular tachycardia Resp:  Normal effort.  Abd:  No distention.  Other:  No swelling in legs   ED Results / Procedures / Treatments   Labs (all labs ordered are listed, but only abnormal results are displayed) Labs Reviewed  COMPREHENSIVE METABOLIC PANEL - Abnormal; Notable for the following components:      Result Value   Glucose, Bld 166 (*)    Calcium 8.7 (*)    AST 44 (*)    ALT 47 (*)    All other components within normal limits  BRAIN NATRIURETIC PEPTIDE - Abnormal; Notable for the following components:   B Natriuretic Peptide 523.0 (*)     All other components within normal limits  CBC WITH DIFFERENTIAL/PLATELET  MAGNESIUM  TROPONIN I (HIGH SENSITIVITY)     EKG  My interpretation of EKG:  6 atrial fibrillation rate of 152 without any ST elevation or T wave inversions, normal intervals  RADIOLOGY I have reviewed the xray personally and interpreted some small pleural effusions   PROCEDURES:  Critical Care performed: Yes, see critical care procedure note(s)  .1-3 Lead EKG Interpretation  Performed by: Concha Se, MD Authorized by: Concha Se, MD     Interpretation: abnormal     ECG rate:  150   ECG rate assessment: tachycardic     Rhythm: atrial fibrillation     Ectopy: none     Conduction: normal   .Critical Care  Performed by: Concha Se, MD Authorized by: Concha Se, MD   Critical care provider statement:    Critical care time (minutes):  30   Critical care was necessary to treat or prevent imminent or life-threatening deterioration of  the following conditions:  Cardiac failure   Critical care was time spent personally by me on the following activities:  Development of treatment plan with patient or surrogate, discussions with consultants, evaluation of patient's response to treatment, examination of patient, ordering and review of laboratory studies, ordering and review of radiographic studies, ordering and performing treatments and interventions, pulse oximetry, re-evaluation of patient's condition and review of old charts    MEDICATIONS ORDERED IN ED: Medications  diltiazem (CARDIZEM) 125 mg in dextrose 5% 125 mL (1 mg/mL) infusion (5 mg/hr Intravenous New Bag/Given 09/09/22 2303)  diltiazem (CARDIZEM) injection 20 mg (20 mg Intravenous Given 09/09/22 2301)     IMPRESSION / MDM / ASSESSMENT AND PLAN / ED COURSE  I reviewed the triage vital signs and the nursing notes.   Patient's presentation is most consistent with acute presentation with potential threat to life or bodily function.    Patient comes in with A-fib with RVR.  Denies any falls or hitting her head.  Patient was given an additional dose of diltiazem and started on a dill drip.  Labs ordered evaluate for Electra abnormalities, chest x-ray to evaluate for any pneumonia, pneumothorax.  Will test for COVID and flu   BNP similar to baseline.  CBC stable CMP slightly elevated LFTs but patient does not really have any pain.  Magnesium normal troponin negative.  Heart rates have come down on Dilt drip.  Will discuss hospital team for admission The patient is on the cardiac monitor to evaluate for evidence of arrhythmia and/or significant heart rate changes.      FINAL CLINICAL IMPRESSION(S) / ED DIAGNOSES   Final diagnoses:  Atrial fibrillation with RVR (HCC)     Rx / DC Orders   ED Discharge Orders     None        Note:  This document was prepared using Dragon voice recognition software and may include unintentional dictation errors.   Concha Se, MD 09/09/22 [phone]

## 2022-09-09 NOTE — ED Triage Notes (Signed)
States she has been feeling short of breath and papillations for the past week. States she was trying to wait to see her cardiologist Monday. States she was diagnosed with Afib in September.

## 2022-09-10 ENCOUNTER — Other Ambulatory Visit: Payer: Self-pay

## 2022-09-10 ENCOUNTER — Inpatient Hospital Stay
Admit: 2022-09-10 | Discharge: 2022-09-10 | Disposition: A | Discharge status: Home / Self Care | Payer: Medicare PPO | Attending: Family Medicine | Admitting: Family Medicine

## 2022-09-10 ENCOUNTER — Encounter: Payer: Self-pay | Admitting: Family Medicine

## 2022-09-10 DIAGNOSIS — I10 Essential (primary) hypertension: Secondary | ICD-10-CM | POA: Diagnosis present

## 2022-09-10 DIAGNOSIS — I482 Chronic atrial fibrillation, unspecified: Secondary | ICD-10-CM | POA: Diagnosis not present

## 2022-09-10 DIAGNOSIS — I5031 Acute diastolic (congestive) heart failure: Secondary | ICD-10-CM

## 2022-09-10 DIAGNOSIS — M810 Age-related osteoporosis without current pathological fracture: Secondary | ICD-10-CM | POA: Insufficient documentation

## 2022-09-10 DIAGNOSIS — J449 Chronic obstructive pulmonary disease, unspecified: Secondary | ICD-10-CM | POA: Diagnosis not present

## 2022-09-10 DIAGNOSIS — Z789 Other specified health status: Secondary | ICD-10-CM | POA: Insufficient documentation

## 2022-09-10 DIAGNOSIS — I480 Paroxysmal atrial fibrillation: Secondary | ICD-10-CM | POA: Insufficient documentation

## 2022-09-10 DIAGNOSIS — E785 Hyperlipidemia, unspecified: Secondary | ICD-10-CM | POA: Insufficient documentation

## 2022-09-10 LAB — ECHOCARDIOGRAM COMPLETE
AR max vel: 1.76 cm2
AV Peak grad: 4.4 mmHg
Ao pk vel: 1.05 m/s
Area-P 1/2: 4.36 cm2
Calc EF: 48.5 %
Height: 64 in
S' Lateral: 3.4 cm
Single Plane A2C EF: 59 %
Single Plane A4C EF: 33.9 %
Weight: 2400 oz

## 2022-09-10 LAB — BASIC METABOLIC PANEL
Anion gap: 9 (ref 5–15)
BUN: 16 mg/dL (ref 8–23)
CO2: 25 mmol/L (ref 22–32)
Calcium: 9.2 mg/dL (ref 8.9–10.3)
Chloride: 104 mmol/L (ref 98–111)
Creatinine, Ser: 0.86 mg/dL (ref 0.44–1.00)
GFR, Estimated: >60 mL/min (ref >60)
Glucose, Bld: 132 mg/dL — ABNORMAL HIGH (ref 70–99)
Potassium: 4.8 mmol/L (ref 3.5–5.1)
Sodium: 138 mmol/L (ref 135–145)

## 2022-09-10 LAB — CBC
HCT: 42.9 % (ref 36.0–46.0)
Hemoglobin: 13.8 g/dL (ref 12.0–15.0)
MCH: 29.2 pg (ref 26.0–34.0)
MCHC: 32.2 g/dL (ref 30.0–36.0)
MCV: 90.7 fL (ref 80.0–100.0)
Platelets: 257 10*3/uL (ref 150–400)
RBC: 4.73 MIL/uL (ref 3.87–5.11)
RDW: 12.7 % (ref 11.5–15.5)
WBC: 9.5 10*3/uL (ref 4.0–10.5)
nRBC: 0 % (ref 0.0–0.2)

## 2022-09-10 LAB — RESP PANEL BY RT-PCR (RSV, FLU A&B, COVID)  RVPGX2
Influenza A by PCR: NEGATIVE
Influenza B by PCR: NEGATIVE
Resp Syncytial Virus by PCR: NEGATIVE
SARS Coronavirus 2 by RT PCR: NEGATIVE

## 2022-09-10 MED ORDER — DIGOXIN 0.25 MG/ML IJ SOLN
0.2500 mg | Freq: Once | INTRAMUSCULAR | Status: AC
  Administered 2022-09-10: 0.25 mg via INTRAVENOUS
  Filled 2022-09-10: qty 2

## 2022-09-10 MED ORDER — POTASSIUM CHLORIDE 20 MEQ PO PACK
20.0000 meq | PACK | Freq: Two times a day (BID) | ORAL | Status: DC
  Administered 2022-09-10 (×2): 20 meq via ORAL
  Filled 2022-09-10 (×2): qty 1

## 2022-09-10 MED ORDER — OXYCODONE HCL 5 MG PO TABS: 5.0000 mg | ORAL_TABLET | ORAL | Status: DC | PRN

## 2022-09-10 MED ORDER — FUROSEMIDE 10 MG/ML IJ SOLN
40.0000 mg | Freq: Two times a day (BID) | INTRAMUSCULAR | Status: DC
  Administered 2022-09-10: 40 mg via INTRAVENOUS
  Filled 2022-09-10: qty 4

## 2022-09-10 MED ORDER — FUROSEMIDE 10 MG/ML IJ SOLN
20.0000 mg | Freq: Every day | INTRAMUSCULAR | Status: DC
  Administered 2022-09-10 (×2): 20 mg via INTRAVENOUS
  Filled 2022-09-10 (×2): qty 4

## 2022-09-10 MED ORDER — ACETAMINOPHEN 325 MG PO TABS: 650.0000 mg | ORAL_TABLET | Freq: Four times a day (QID) | ORAL | Status: DC | PRN

## 2022-09-10 MED ORDER — TRAZODONE HCL 50 MG PO TABS: 25.0000 mg | ORAL_TABLET | Freq: Every evening | ORAL | Status: DC | PRN

## 2022-09-10 MED ORDER — PNEUMOCOCCAL 20-VAL CONJ VACC 0.5 ML IM SUSY
0.5000 mL | PREFILLED_SYRINGE | INTRAMUSCULAR | Status: DC
  Filled 2022-09-10 (×2): qty 0.5

## 2022-09-10 MED ORDER — ONDANSETRON HCL 4 MG/2ML IJ SOLN: 4.0000 mg | Freq: Four times a day (QID) | INTRAMUSCULAR | Status: DC | PRN

## 2022-09-10 MED ORDER — ONDANSETRON HCL 4 MG PO TABS: 4.0000 mg | ORAL_TABLET | Freq: Four times a day (QID) | ORAL | Status: DC | PRN

## 2022-09-10 MED ORDER — METOPROLOL TARTRATE 5 MG/5ML IV SOLN
5.0000 mg | Freq: Four times a day (QID) | INTRAVENOUS | Status: DC | PRN
  Administered 2022-09-10 – 2022-09-11 (×3): 5 mg via INTRAVENOUS
  Filled 2022-09-10 (×3): qty 5

## 2022-09-10 MED ORDER — SODIUM CHLORIDE 0.9% FLUSH
3.0000 mL | Freq: Two times a day (BID) | INTRAVENOUS | Status: DC
  Administered 2022-09-10 – 2022-09-12 (×6): 3 mL via INTRAVENOUS

## 2022-09-10 MED ORDER — POLYETHYLENE GLYCOL 3350 17 G PO PACK
17.0000 g | PACK | Freq: Every day | ORAL | Status: DC | PRN
  Administered 2022-09-12: 17 g via ORAL
  Filled 2022-09-10: qty 1

## 2022-09-10 MED ORDER — ACETAMINOPHEN 650 MG RE SUPP: 650.0000 mg | Freq: Four times a day (QID) | RECTAL | Status: DC | PRN

## 2022-09-10 MED ORDER — DILTIAZEM HCL ER COATED BEADS 180 MG PO CP24
300.0000 mg | ORAL_CAPSULE | Freq: Every day | ORAL | Status: DC
  Administered 2022-09-10: 300 mg via ORAL
  Filled 2022-09-10: qty 1

## 2022-09-10 NOTE — Assessment & Plan Note (Signed)
On exam patient appears to be in heart failure.  BNP is elevated, JVD is elevated, she has a new O2 requirement.  Likely precipitated by prolonged uncontrolled A-fib with RVR.  Prior TTE in September showed grade 2 diastolic dysfunction with bilateral atrial enlargement, normal EF. - Lasix 20 mg IV twice daily, may need to increase IV - Repeat TTE - Consult cardiology in a.m. - Strict I&O - Daily weights - Replete lites as needed - Telemetry

## 2022-09-10 NOTE — Progress Notes (Signed)
  Echocardiogram 2D Echocardiogram has been performed.  Christina Carr 09/10/2022, 10:05 AM

## 2022-09-10 NOTE — ED Notes (Signed)
Per MD Zhang d/c dilt gtt. Med stopped per MD

## 2022-09-10 NOTE — Assessment & Plan Note (Signed)
Memory care-continue donepezil Depression-continue duloxetine Hypothyroidism-continue Synthroid Hypertension-continue lisinopril, reports she is not taking amlodipine Hyperlipidemia-continue simvastatin

## 2022-09-10 NOTE — ED Notes (Signed)
Assumed care from Erika, RN. Pt resting comfortably in bed at this time. Pt denies any current needs or questions. Call light with in reach.   

## 2022-09-10 NOTE — Assessment & Plan Note (Signed)
Low suspicion that this is contributing to her overall picture given lack of wheezing or other cardinal symptoms of COPD exacerbation, however if no improvement with diuresis could also consider starting antibiotics/steroids.

## 2022-09-10 NOTE — H&P (Signed)
History and Physical    Patient: Christina Carr ZOX:[id number] DOB: 10/04/1945 DOA: 09/09/2022 DOS: the patient was seen and examined on 09/10/2022 PCP: Barbette ReichmannHande, Vishwanath, MD  Patient coming from: Home  Chief Complaint:  Chief Complaint  Patient presents with   Shortness of Breath   Tachycardia   Palpitations   HPI: Christina SanerCarolyn R Speiser is a 76 y.o. female with medical history significant for A-fib, COPD not on home O2, history of tobacco use, hypothyroidism, hypertension, hyperlipidemia, osteoporosis, who presents with several weeks of progressively worsening shortness of breath.  Patient was diagnosed with atrial fibrillation in September and started on rate control and anticoagulation.  She reports that she has been taking her medicines as prescribed.  Over the past several weeks she has noted progressive dyspnea with exertion, such that she cannot even brush her teeth without having to stop and catch her breath.  She no longer is able to sleep in her bed often sleeps in her recliner.  She has not noticed any swelling in her legs.  She denies any chest pain or palpitations.  In the ED vital signs were notable for tachycardia up to the 130s as well as mild hypertension.  CMP showed very mild LFT elevations and hyperglycemia to 166, otherwise unremarkable.  Troponin was normal as was CBC.  BNP was elevated at 523.  Chest x-ray showed signs of pulmonary congestion as well as small bilateral pleural effusions.  EKG showed A-fib with RVR but no acute ischemic changes.  Due to significant RVR she was initially given a 20 mg IV loading dose of diltiazem and started on a diltiazem drip with good effect.  Review of Systems: As mentioned in the history of present illness. All other systems reviewed and are negative. Past Medical History:  Diagnosis Date   Anemia    Anxiety    Arthritis    osteoporosis too   Cataracts, bilateral    Cervical disc herniation    COPD (chronic obstructive pulmonary  disease) (HCC)    mild emphysema   Depression    Goiter    Hypercholesteremia    Hypertension    8/18 Stress and Echo in 04/2017 WNL   Hypothyroidism    Osteoporosis    Personal history of tobacco use, presenting hazards to health 12/29/2015   PONV (postoperative nausea and vomiting)    only with hysterectomy   Thyroid disease    Past Surgical History:  Procedure Laterality Date   ABDOMINAL HYSTERECTOMY     AUGMENTATION MAMMAPLASTY Bilateral 1995   silicone   CATARACT EXTRACTION W/PHACO Left 07/12/2017   Procedure: CATARACT EXTRACTION PHACO AND INTRAOCULAR LENS PLACEMENT (IOC)-LEFT;  Surgeon: Nevada CraneKing, Bradley Mark, MD;  Location: [facility] ORS;  Service: Ophthalmology;  Laterality: Left;  Lot # [id number] H US:00:26.7 AP%: 8.0 CDE: 2.14    CATARACT EXTRACTION W/PHACO Right 08/02/2017   Procedure: CATARACT EXTRACTION PHACO AND INTRAOCULAR LENS PLACEMENT (IOC)-RIGHT PRE DIABETIC;  Surgeon: Nevada CraneKing, Bradley Mark, MD;  Location: [facility] ORS;  Service: Ophthalmology;  Laterality: Right;  US 00:19.9 AP% 12.8 CDE 2.55 [employer] lot # [id number]   CESAREAN SECTION     COLONOSCOPY     COLONOSCOPY WITH PROPOFOL N/A 04/29/2018   Procedure: COLONOSCOPY WITH PROPOFOL;  Surgeon: Christena DeemSkulskie, Martin U, MD;  [facility];  Service: Endoscopy;  Laterality: N/A;   EYE SURGERY Left 06/2017   cataract extraction   TUBAL LIGATION     Social History:  reports that she has quit smoking. She has never used smokeless  tobacco. She reports that she does not drink alcohol and does not use drugs.  Allergies  Allergen Reactions   Alendronate     Chest pain   Hydrochlorothiazide    Sulfa Antibiotics Nausea And Vomiting    Family History  Problem Relation Age of Onset   Cancer Mother        cervical   Cirrhosis Father    Hypertension Sister    Cancer Sister 30       cervical   Breast cancer Neg Hx     Prior to Admission medications   Medication Sig Start Date End Date Taking? Authorizing Provider   amLODipine (NORVASC) 10 MG tablet Take 1 tablet by mouth daily. 06/26/22  Yes [provider]  apixaban (ELIQUIS) 5 MG TABS tablet Take 1 tablet (5 mg total) by mouth 2 (two) times daily. 06/23/22 09/21/22 Yes Darlin Priestly, MD  Calcium Carb-Cholecalciferol (CALCIUM 600 + D PO) Take 1 tablet by mouth daily after supper.   Yes [provider]  Coenzyme Q10 (CO Q 10) 10 MG CAPS Take 10 mg by mouth daily.   Yes [provider]  donepezil (ARICEPT) 5 MG tablet Take 5 mg by mouth at bedtime. 04/10/22  Yes [provider]  DULoxetine (CYMBALTA) 20 MG capsule Take 20 mg by mouth daily. 05/09/22  Yes [provider]  Glucosamine Sulfate 1000 MG CAPS Take 1,000 mg by mouth 2 (two) times daily. After breakfast and after supper.   Yes [provider]  levothyroxine (SYNTHROID) 88 MCG tablet Take 88 mcg by mouth daily before breakfast. 07/04/22 07/04/23 Yes [provider]  lisinopril (PRINIVIL,ZESTRIL) 10 MG tablet Take 10 mg by mouth daily before breakfast.   Yes [provider]  melatonin 5 MG TABS Take 10 mg by mouth at bedtime as needed.   Yes [provider]  metoprolol tartrate (LOPRESSOR) 25 MG tablet Take 1 tablet (25 mg total) by mouth 2 (two) times daily. 06/23/22 09/21/22 Yes Darlin Priestly, MD  simvastatin (ZOCOR) 20 MG tablet Take 20 mg by mouth daily at 8 pm. 2100   Yes [provider]  B Complex-C (SUPER B COMPLEX PO) Take 1 capsule by mouth daily after breakfast. Patient not taking: Reported on 09/09/2022    [provider]  cholecalciferol (VITAMIN D) 1000 units tablet Take 1,000 Units by mouth daily after breakfast. Patient not taking: Reported on 09/09/2022    [provider]  Choline Dihydrogen Citrate (CHOLINE CITRATE PO) Take 1 tablet daily after breakfast by mouth. Also contains inositol Patient not taking: Reported on 09/09/2022    [provider]  Ginkgo Biloba 60 MG CAPS Take 60 mg  by mouth 2 (two) times daily.  Patient not taking: Reported on 09/09/2022    [provider]  Inositol 324 MG TABS Take by mouth.  Patient not taking: Reported on 09/09/2022    [provider]  Polyvinyl Alcohol-Povidone PF (REFRESH) 1.4-0.6 % SOLN Place 1-2 drops into the right eye 3 (three) times daily as needed (for dry eyes.).  Patient not taking: Reported on 09/09/2022    [provider]  PROLIA 60 MG/ML SOSY injection Inject into the skin. Patient not taking: Reported on 09/09/2022 05/12/22   [provider]    Physical Exam: Vitals:   09/09/22 2230 09/09/22 2245 09/09/22 2300 09/09/22 2315  BP: (!) 132/92 (!) 135/108 (!) 134/121 112/79  Pulse: 92 94 92 (!) 54  Resp: (!) 21 20 (!) 21 20  Temp:      TempSrc:      SpO2: 95% 95% 95% 95%  Weight:      Height:       Physical Exam Vitals reviewed.  Constitutional:      Appearance: She is ill-appearing. She is not diaphoretic.     Comments: Appears in mild respiratory distress  HENT:     Head: Normocephalic and atraumatic.  Cardiovascular:     Rate and Rhythm: Normal rate. Rhythm irregular.     Heart sounds: Normal heart sounds.  Pulmonary:     Effort: Pulmonary effort is normal.     Breath sounds: Decreased breath sounds present.     Comments: Mild respiratory distress. Wearing 4L O2. Able to speak in complete sentences but clearly leaves her winded. Mildly decreased breath sounds throughout, no wheezes or rales Abdominal:     General: Bowel sounds are normal.     Palpations: Abdomen is soft.  Musculoskeletal:     Right lower leg: No edema.     Left lower leg: No edema.  Skin:    General: Skin is warm and dry.  Neurological:     General: No focal deficit present.     Mental Status: She is alert.  Psychiatric:        Mood and Affect: Mood is anxious.        Behavior: Behavior normal.      Data Reviewed: Results for orders placed or performed during the hospital encounter of  09/09/22 (from the past 24 hour(s))  CBC with Differential     Status: None   Collection Time: 09/09/22 10:19 PM  Result Value Ref Range   WBC 10.5 4.0 - 10.5 K/uL   RBC 4.73 3.87 - 5.11 MIL/uL   Hemoglobin 13.8 12.0 - 15.0 g/dL   HCT 60.7 37.1 - 06.2 %   MCV 91.3 80.0 - 100.0 fL   MCH 29.2 26.0 - 34.0 pg   MCHC 31.9 30.0 - 36.0 g/dL   RDW 69.4 85.4 - 62.7 %   Platelets 275 150 - 400 K/uL   nRBC 0.0 0.0 - 0.2 %   Neutrophils Relative % 57 %   Neutro Abs 6.0 1.7 - 7.7 K/uL   Lymphocytes Relative 36 %   Lymphs Abs 3.7 0.7 - 4.0 K/uL   Monocytes Relative 6 %   Monocytes Absolute 0.6 0.1 - 1.0 K/uL   Eosinophils Relative 0 %   Eosinophils Absolute 0.0 0.0 - 0.5 K/uL   Basophils Relative 1 %   Basophils Absolute 0.1 0.0 - 0.1 K/uL   Immature Granulocytes 0 %   Abs Immature Granulocytes 0.03 0.00 - 0.07 K/uL  Comprehensive metabolic panel     Status: Abnormal   Collection Time: 09/09/22 10:19 PM  Result Value Ref Range   Sodium 136 135 - 145 mmol/L   Potassium 4.5 3.5 - 5.1 mmol/L   Chloride 105 98 - 111 mmol/L   CO2 23 22 - 32 mmol/L   Glucose, Bld 166 (H) 70 - 99 mg/dL   BUN 19 8 - 23 mg/dL   Creatinine, Ser 0.35 0.44 - 1.00 mg/dL   Calcium 8.7 (L) 8.9 - 10.3 mg/dL   Total Protein 6.7 6.5 - 8.1 g/dL   Albumin 3.8 3.5 - 5.0 g/dL   AST 44 (H) 15 - 41 U/L   ALT 47 (H) 0 - 44 U/L   Alkaline Phosphatase 55 38 - 126 U/L   Total Bilirubin 0.8 0.3 - 1.2 mg/dL  GFR, Estimated >60 >60 mL/min   Anion gap 8 5 - 15  Magnesium     Status: None   Collection Time: 09/09/22 10:19 PM  Result Value Ref Range   Magnesium 2.0 1.7 - 2.4 mg/dL  Troponin I (High Sensitivity)     Status: None   Collection Time: 09/09/22 10:19 PM  Result Value Ref Range   Troponin I (High Sensitivity) 13 <18 ng/L  Brain natriuretic peptide     Status: Abnormal   Collection Time: 09/09/22 10:19 PM  Result Value Ref Range   B Natriuretic Peptide 523.0 (H) 0.0 - 100.0 pg/mL   DG Chest Portable 1  View  Result Date: 09/09/2022 CLINICAL DATA:  Chest pain and shortness of breath. EXAM: PORTABLE CHEST 1 VIEW COMPARISON:  Chest radiograph dated June 22, 2022 FINDINGS: The heart appears enlarged on this AP projection. Atherosclerotic calcification of the aortic arch. Pulmonary vascular congestion and bilateral lower lobe opacities suggesting small bilateral pleural effusion. Associated atelectasis and/or underlying airspace disease can not be excluded. IMPRESSION: Pulmonary vascular congestion and bilateral lower lobe opacities suggesting small bilateral pleural effusion. Associated atelectasis and/or underlying airspace disease can not be excluded. Electronically Signed   By: Larose Hires D.O.   On: 09/09/2022 22:49    ECG (my read): Afib w RVR, no acute ischemic changes Assessment and Plan: CARMIN ALVIDREZ is a 76 y.o. female with medical history significant for A-fib, COPD not on home O2, history of tobacco use, hypothyroidism, hypertension, hyperlipidemia, osteoporosis, who presents with several weeks of progressively worsening shortness of breath and is found to be in acute heart failure.   * Acute diastolic (congestive) heart failure (HCC) On exam patient appears to be in heart failure.  BNP is elevated, JVD is elevated, she has a new O2 requirement.  Likely precipitated by prolonged uncontrolled A-fib with RVR.  Prior TTE in September showed grade 2 diastolic dysfunction with bilateral atrial enlargement, normal EF. - Lasix 20 mg IV twice daily, may need to increase IV - Repeat TTE - Consult cardiology in a.m. - Strict I&O - Daily weights - Replete lites as needed - Telemetry  Known medical problems Memory care-continue donepezil Depression-continue duloxetine Hypothyroidism-continue Synthroid Hypertension-continue lisinopril, reports she is not taking amlodipine Hyperlipidemia-continue simvastatin  COPD (chronic obstructive pulmonary disease) (HCC) Low suspicion that this  is contributing to her overall picture given lack of wheezing or other cardinal symptoms of COPD exacerbation, however if no improvement with diuresis could also consider starting antibiotics/steroids.  A-fib (HCC) Currently rate controlled with diltiazem drip.  Also taking metoprolol, would benefit from switching metoprolol to XL for cardiac benefits and increasing dose when she comes off of dilt drip.  Continue apixaban.      Advance Care Planning:   Code Status: Full Code   Consults: Cardiology in AM  Family Communication: none  Severity of Illness: The appropriate patient status for this patient is INPATIENT. Inpatient status is judged to be reasonable and necessary in order to provide the required intensity of service to ensure the patient's safety. The patient's presenting symptoms, physical exam findings, and initial radiographic and laboratory data in the context of their chronic comorbidities is felt to place them at high risk for further clinical deterioration. Furthermore, it is not anticipated that the patient will be medically stable for discharge from the hospital within 2 midnights of admission.   * I certify that at the point of admission it is my clinical judgment that the patient will require  inpatient hospital care spanning beyond 2 midnights from the point of admission due to high intensity of service, high risk for further deterioration and high frequency of surveillance required.*  Author: Venora Maples, MD 09/10/2022 12:28 AM  For on call review [email or web address].

## 2022-09-10 NOTE — Hospital Course (Signed)
Christina Carr is a 76 y.o. female with medical history significant for A-fib, COPD not on home O2, history of tobacco use, hypothyroidism, hypertension, hyperlipidemia, osteoporosis, who presents with several weeks of progressively worsening shortness of breath.  Chest x-ray showed bilateral pleural effusion and pulmonary congestion.  EKG showed A-fib with RVR.  Patient was given IV Lasix, placed on diltiazem drip.

## 2022-09-10 NOTE — Assessment & Plan Note (Addendum)
Currently rate controlled with diltiazem drip.  Also taking metoprolol, would benefit from switching metoprolol to XL for cardiac benefits and increasing dose when she comes off of dilt drip.  Continue apixaban.

## 2022-09-10 NOTE — Progress Notes (Signed)
  Progress Note   Patient: Christina Carr:[id number] DOB: April 13, 1946 DOA: 09/09/2022     1 DOS: the patient was seen and examined on 09/10/2022   Brief hospital course:  Christina Carr is a 76 y.o. female with medical history significant for A-fib, COPD not on home O2, history of tobacco use, hypothyroidism, hypertension, hyperlipidemia, osteoporosis, who presents with several weeks of progressively worsening shortness of breath.  Chest x-ray showed bilateral pleural effusion and pulmonary congestion.  EKG showed A-fib with RVR.  Patient was given IV Lasix, placed on diltiazem drip.  Assessment and Plan: Acute on chronic diastolic congestive heart failure. Paroxysmal atrial fibrillation with RVR. Patient is volume overloaded, with elevated BNP, chest x-ray showed vascular congestion with pleural effusion.  This probably triggered by tachycardia from atrial fibrillation. Patient started on IV Lasix, will increase dose of 40 mg twice a day. Patient still tachycardic, will start oral diltiazem, give a dose of digoxin, added as needed IV metoprolol.  Will wean down IV diltiazem. Continue anticoagulation.  COPD. Stable.  Essential hypertension Continue some home medicines.     Subjective:  Patient feels better today with shortness of breath.  No cough.   Physical Exam: Vitals:   09/10/22 0630 09/10/22 0645 09/10/22 0730 09/10/22 0745  BP: 113/78 109/75 (!) 154/80 139/88  Pulse: 97 87 83 87  Resp: (!) 23 (!) 22 (!) 23 (!) 24  Temp:      TempSrc:      SpO2: 96% 96% 98% 98%  Weight:      Height:       General exam: Appears calm and comfortable  Respiratory system: Mild crackles in bilateral lungs. Respiratory effort normal. Cardiovascular system: Irregularly irregular tachycardia. No JVD, murmurs, rubs, gallops or clicks. No pedal edema. Gastrointestinal system: Abdomen is nondistended, soft and nontender. No organomegaly or masses felt. Normal bowel sounds heard. Central  nervous system: Alert and oriented. No focal neurological deficits. Extremities: Symmetric 5 x 5 power. Skin: No rashes, lesions or ulcers Psychiatry: Judgement and insight appear normal. Mood & affect appropriate.   Data Reviewed:  Reviewed prior echocardiogram results, chest x-ray, lab results.  Family Communication: not able to reach sister  Disposition: Status is: Inpatient Remains inpatient appropriate because: Severity of disease, IV treatment.  Planned Discharge Destination: Home with Home Health    Time spent: 35 minutes, no charge  Author: Marrion Coy, MD 09/10/2022 12:34 PM  For on call review [email or web address].

## 2022-09-10 NOTE — ED Notes (Signed)
Advised nurse that patient has ready bed 

## 2022-09-11 DIAGNOSIS — J9601 Acute respiratory failure with hypoxia: Secondary | ICD-10-CM | POA: Insufficient documentation

## 2022-09-11 DIAGNOSIS — I480 Paroxysmal atrial fibrillation: Secondary | ICD-10-CM | POA: Diagnosis not present

## 2022-09-11 DIAGNOSIS — I5033 Acute on chronic diastolic (congestive) heart failure: Secondary | ICD-10-CM

## 2022-09-11 LAB — BASIC METABOLIC PANEL
Anion gap: 9 (ref 5–15)
BUN: 15 mg/dL (ref 8–23)
CO2: 29 mmol/L (ref 22–32)
Calcium: 9.2 mg/dL (ref 8.9–10.3)
Chloride: 98 mmol/L (ref 98–111)
Creatinine, Ser: 1.03 mg/dL — ABNORMAL HIGH (ref 0.44–1.00)
GFR, Estimated: 56 mL/min — ABNORMAL LOW (ref >60)
Glucose, Bld: 168 mg/dL — ABNORMAL HIGH (ref 70–99)
Potassium: 4 mmol/L (ref 3.5–5.1)
Sodium: 136 mmol/L (ref 135–145)

## 2022-09-11 LAB — TSH: TSH: 2.578 u[IU]/mL (ref 0.350–4.500)

## 2022-09-11 MED ORDER — METOPROLOL TARTRATE 25 MG PO TABS
25.0000 mg | ORAL_TABLET | Freq: Two times a day (BID) | ORAL | Status: DC
  Administered 2022-09-11 – 2022-09-12 (×3): 25 mg via ORAL
  Filled 2022-09-11 (×3): qty 1

## 2022-09-11 MED ORDER — ENSURE ENLIVE PO LIQD
237.0000 mL | Freq: Two times a day (BID) | ORAL | Status: DC
  Administered 2022-09-12: 237 mL via ORAL

## 2022-09-11 MED ORDER — DILTIAZEM HCL ER COATED BEADS 180 MG PO CP24
360.0000 mg | ORAL_CAPSULE | Freq: Every day | ORAL | Status: DC
  Administered 2022-09-11 – 2022-09-12 (×2): 360 mg via ORAL
  Filled 2022-09-11 (×2): qty 2

## 2022-09-11 MED ORDER — FUROSEMIDE 10 MG/ML IJ SOLN: 40.0000 mg | Freq: Two times a day (BID) | INTRAMUSCULAR | Status: DC

## 2022-09-11 MED ORDER — FUROSEMIDE 10 MG/ML IJ SOLN
40.0000 mg | Freq: Every day | INTRAMUSCULAR | Status: DC
  Administered 2022-09-11 – 2022-09-12 (×2): 40 mg via INTRAVENOUS
  Filled 2022-09-11 (×2): qty 4

## 2022-09-11 MED ORDER — ADULT MULTIVITAMIN W/MINERALS CH
1.0000 | ORAL_TABLET | Freq: Every day | ORAL | Status: DC
  Administered 2022-09-12: 1 via ORAL
  Filled 2022-09-11: qty 1

## 2022-09-11 NOTE — Progress Notes (Signed)
  Progress Note   Patient: Christina Carr ZNB:[id number] DOB: 04-Jul-1946 DOA: 09/09/2022     2 DOS: the patient was seen and examined on 09/11/2022   Brief hospital course:  Christina Carr is a 76 y.o. female with medical history significant for A-fib, COPD not on home O2, history of tobacco use, hypothyroidism, hypertension, hyperlipidemia, osteoporosis, who presents with several weeks of progressively worsening shortness of breath.  Chest x-ray showed bilateral pleural effusion and pulmonary congestion.  EKG showed A-fib with RVR.  Patient was given IV Lasix, placed on diltiazem drip.  Assessment and Plan: Acute on chronic diastolic congestive heart failure. Paroxysmal atrial fibrillation with RVR. Acute hypoxemic respiratory or secondary to congestive heart failure.  POA. Patient is volume overloaded, with elevated BNP, chest x-ray showed vascular congestion with pleural effusion.  This probably triggered by tachycardia from atrial fibrillation. Patient developed some renal insufficiency, reduced IV Lasix dose. Heart rate still elevated, increase diltiazem to 360 mg daily, added metoprolol 25 mg daily.  Continue as needed IV Toprol.  COPD. Stable.   Essential hypertension Continue some home medicines.      Subjective:  Patient feels better with shortness of breath, but still on 2 L oxygen.  Physical Exam: Vitals:   09/11/22 0435 09/11/22 0619 09/11/22 0738 09/11/22 1149  BP:  103/75 124/67 128/87  Pulse:  63 (!) 107 87  Resp:  16 14 16   Temp:  98 F (36.7 C) 98.2 F (36.8 C) 97.9 F (36.6 C)  TempSrc:      SpO2:  94% 94% 95%  Weight: 64.4 kg     Height:       General exam: Appears calm and comfortable  Respiratory system: Clear to auscultation. Respiratory effort normal. Cardiovascular system: Irregularly irregular, tachycardic. No JVD, murmurs, rubs, gallops or clicks. No pedal edema. Gastrointestinal system: Abdomen is nondistended, soft and nontender. No  organomegaly or masses felt. Normal bowel sounds heard. Central nervous system: Alert and oriented. No focal neurological deficits. Extremities: Symmetric 5 x 5 power. Skin: No rashes, lesions or ulcers Psychiatry: Judgement and insight appear normal. Mood & affect appropriate.   Data Reviewed:  Lab results reviewed.  Family Communication: None  Disposition: Status is: Inpatient Remains inpatient appropriate because: Severity of disease, IV treatment  Planned Discharge Destination: Home with Home Health    Time spent: 35 minutes  Author: , MD 09/11/2022 12:25 PM  For on call review [email or web address].

## 2022-09-11 NOTE — Progress Notes (Signed)
  Transition of Care [facility]) Screening Note   Patient Details  Name: Christina Carr Date of Birth: 1946/03/24   Transition of Care [facility]) CM/SW Contact:    Darolyn Rua, LCSW Phone Number: 09/11/2022, 8:22 AM  TOC consulted for heart failure screen. Heart failure RN Ricarda Frame has been informed.    Transition of Care Department [facility]) has reviewed patient and no TOC needs have been identified at this time. We will continue to monitor patient advancement through interdisciplinary progression rounds. If new patient transition needs arise, please place a TOC consult.  Darolyn Rua, [place], MSW, [place] [phone]

## 2022-09-11 NOTE — Consult Note (Signed)
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 Barges36DuanePeggyann78Kentuckyh602Kentucky1MSouthern Bone And Joint Asc LZOKentuckyXWR'U16ShKeKentucky53161776Kentucky442-Baptist Health Medical Center - NortZOXWIntegris Health EdmoKentuckyndRMarland McKentucky39ChristKentuckyiane Kentucky1337-7ChW71UJWJ'ChGlenKorTr43wOuachita Co. EasSummVer82gKentuckyBaptist Medical Center YazooiPeLaurencKoreVa Montana Healthca37r40Bhc Alhambra HosIsidoro Donn53w1255d0Labette Hea30CoopVa GulPatsy Bal8Katrinka BlazinAmanda PeaSwazilKeWilhe098AvoCuero Community HospitElonda EpimenioOkey DTerRica 098T[id number] Carr(438Marland Kitchen)Orchard HospitKentuckyaKentuckLoreHomeroCharm Barges185DuanePeggyann43Kentuckyh867Kentucky1MBgc Holdings IZOKentuckyXWR'UKentucky6116113Kentucky47Johnston Medical CenterZOXWIdaho State Hospital NorKentuckythRMarland McKentucky61ChristKentuckyiane Kentucky1709ChW80UJWJ'ChryDownRiverKorTr85wPam Specialty Hospital Of VThVer90gKentuckyEncompass Health Rehabilitation Hospital The VintageiPeLaurencKoreCgs Endoscopy Ce58n40Advanced Family Surgery CIsidoro Donn90w63735d0Great Lakes Endoscopy Cen30Patsy Bal7Katrinka BlazinAmanda PeaSwazilKeWilhe098AvoMid-Valley HospitElonda EpimenioOkey DTerRica 098BagneMea96045Eri30816109Shelbie A248Marland Kitchen-O[place] Outpatient Surgery Limited PartnershiShelKentuckylKentuckLoreHomerCharm Barges319DuanPeggyannC35Kentuckyh(786Kentucky1MEndoscopy Center At Towson IZOKentuckyXWR'U16ShKentucKentucky36161334[place]entucky(725)Columbia Eye SurgeZOXWSandy Springs Center For Urologic SurgeKentuckyryRMarland McKentucky23ChristKentuckyiane Kentucky1858-6ChW74UJWJ'ChrAckermanvKorTr50wAustin Va Outpatient Cl161KentuckyHoShVer66gKentuckySparta Community HospitaliPeLaurencKoreNorth East Alliance Surge59r40Umass Memorial Medical Center - Memorial CIsidoro Donn68w2035d0Va Central Ar. [facility] System30Patsy Bal3Katrinka BlazinAmanda PeaSwazilKeWilhe098AvoCommunity Health Center Of Branch CounElonda EpimenioOkey DTerRica 098WaterlWincheste96045Eri30816109Shelbie A352Marland Kitchen-St Elizabeths Medical CenteShelly Ru796Kore32a96295937Tera MKentuckyaKentuckLoreHomeroCharm Barges39wDuanPeggyann47Kentuckyh540Kentucky1MDesert View Regional Medical CentZOKentuckyXWR'U16ShKentuckyarKentucky24161728Kentucky787-Jefferson Regional MZOXWRoundup Memorial HealthcaKentuckyreRMarland McKentucky77ChristKentuckyiane Kentucky1843-6ChW75UJWJ'ChryBeKorTr35wAmerican Fork Hosp16Ver29gKentuckyEden Springs Healthcare LLCiPeLaurencKoreBlue Mountain87 40Holdenville General HosIsidoro Donn62w2065d0Delta Endoscopy Center30ChEndo Group LLC Christina Bal7Katrinka BlazinAmanda PeaSwazilKeWilhe098AvoEast Portland Surgery Center LElonda EpimenioOkey DTerRica 098PascoEast [id number] Carr(269Marland Kitchen)Orange Park Medical CenteShellyKentucky KentuckLoreHomerCharm BargesDuanePeggyannCh62[place](317)[place]1MWellspan Ephrata Community HospitZOKentuckyKentucky75161391Kentucky325-Vibra HospZOXWLangley Porter Psychiatric InstituKentuckyteRMarland McKentucky89ChristKentuckyiane Kentucky1(947) 2ChW81UJWJ'ChryCrosCorKorTr9wAmVer53gKentuckyiP40Kindred Hospital ToIsidoro Donni557ng0Parkridge Valley Adult Servi30865Watsonville CommuPatsy Bal42RegKentucky1610WilheminHosp DKentuckyeKentuckLoreHomero6Charm Barges43DuanePeggyann16Kentuckyh718Kentucky1MTilden Community HospitZOKentuckyXWR'U16ShKentuckyarlKentucky11161453Kentucky850-Gila River Health CarZOXWOphthalmology Surgery Center Of Orlando LLC Dba Orlando Ophthalmology Surgery CentKentuckyerRMarland McKentucky60ChristKentuckyiane Kentucky1(712) 8ChW58UJWJ'ChryEaClaKorTr46wFullerton Surgery Ce16Lake DonEast VanderVer19gKentuckyShare Memorial HospitaliPeLaurencKoreOlmsted Medic23a40Cec Dba BelmontIsidoro Donn20w10625d0Memorial Hermann Cypress Hospi30SalMethPatsy BalKatrinka BlazinAmanda PeaSwazilKeWilhe098AvoAdvanced Endoscopy Center PLElonda EpimenioOkey DTerRica 098MoorpaMobile Mercer Ltd [id number] A959Marland Kitchen-Montgomery KentuckyGKentuckLoreHomeroCharm Barges46DuanePeggyann88Kentuckyh620Kentucky1MSaint Joseph Mount SterliZOKentuckyXWR'U16ShKentuckyaKentucky58161423Kentucky814-Four Seasons Surgery Centers ZOXWDartmouth Hitchcock Ambulatory Surgery CentKentuckyerRMarland McKentucky73ChristKentuckyiane Kentucky1302-4ChW33UJWJ'ChryBellerose THaKorTr86wWesley Woods Geriatric Hosp161KentuMcCalPryor Ver97gKentuckyMercy Hospital JeffersoniPeLaurencKoreSurgery Center Of 33S40Corpus Christi Rehabilitation HosIsidoro Donn47w85445d0Sunnyview Rehabilitation Hospi30ChambeAdvanced Surgery Center Of M30eChMEncomPatsy Bal4Katrinka BlazinAmanda PeaSwazilKeWilhe098AvoHealing Arts Day SurgeElonda EpimenioOkey DTerRica 098WellsvilEndoscopy Ass96045Eri30816109Shelbie A830Marland Kitchen-Aspirus Riverview Hsptl AssoKentuckySKentuckLoreHomeroCharm Barges267wDuanePeggyann87Kentuckyh914Kentucky1MRockwall Heath Ambulatory Surgery Center LLP Dba [facility] At HeaZKentucky74161657Kentucky(773) Roper St Francis BerkZOXWNaples Day Surgery LLC Dba Naples Day Surgery SouKentuckythRMarland McKentucky12ChristKentuckyiane Kentucky1843-7ChW63UJWJ'ChryMcKeKorTr62wBradford Place Surgery And LasVilla Ver11gKentuckySparrow Specialty HospitaliPeLaurencKoreVa Black Hills Healthcare System - F48o40Mercy St Anne HosIsidoro Donn10w73685d0Tulane Medical Cen30RPatsy Bal6Katrinka BlazinAmanda PeaSwazilKeWilhe098AvoPacific Endoscopy Center LElonda EpimenioOkey DTerRica 098ChandlSelect Specia96045Eri30816109Shelbie A772Marland Kitchen-ChillicKentuckyoKentuckLoreHomeCharm Barges52DuanePeggya110KentuckynnCh2Kentucky1MVa Medical Center - West Roxbury DivisiZOKentuckyXWR'U16ShKentuckyaKentucky54161457Kentucky806-Einstein Medical CentZOXW1800 M[address] Surgery Center LKentuckyLCRMarland McKentucky52ChristKentuckyiane Kentucky1934-6ChW46UJWJ'ChryChowCerro GKorTr49wAdvanced EndoscBelChickamaw Ver38gKentuckySouthwest General HospitaliPeLaurencKoreCarthage Area49 40Baylor Scott And White Surgicare CarroIsidoro Donn61w6085d0Wills Eye Surgery Center At Plymoth Meet30CKaisePatsy Bal9Katrinka BlazinAmanda PeaSwazilKeWilhe098AvoSelect Specialty Hospital Warren CampElonda EpimenioOkey DTerRica 098MeriS96045Eri30816109Shelbie A9Marland Kitchen7Strategic Behavioral Center LelanShelly Ru26KentuckyKKentuckLoreHomeCharm Barges92DuanePeggyannC87Kentuckyh(208Kentucky1MMidwest Endoscopy Center LZOKenKentucky51161356Kentucky(431) Taylor RegiZOXWEndoscopy Center Of MarKentuckyinRMarland McKentucky70ChristKentuckyiane Kentucky1667ChW84UJWJ'ChryBird New MaKorTr58wSan Joaquin Laser And Surgery Center161KenGlWashingtonVer60gKentuckyHillside Diagnostic And Treatment Center LLCiPeLaurencKoreChi St Alexius Health Tu31r40Susquehanna Valley Surgery CIsidoro Donn51w410415d0Baytown Endoscopy Center LLC Dba Baytown Endoscopy Cen30CountryPatsy Bal1Katrinka BlazinAmanda PeaSwazilKeWilhe098AvoVirginia Beach Psychiatric CentElonda EpimenioOkey DTerRica 098ManistRiversi96045Eri30816109Shelbie A386Marland Kitchen-Medical Center Of The RockieShelly Ru806Kore64a96295581-0Tera Ma0001Char335323itSurgery CenteKentuckyrKentuckLoreHomeroCharm Barges56DuanePeggyann24Kentuckyh418Kentucky1MBakersfield Heart HospitZOKentuckyXWR'U16SKentucky43161176Kentucky947 Three Rivers EndoscoZOXWWestside Regional Medical CentKentuckyerRMarland McKentucky42ChristKentuckyiane Kentucky1367-1ChW74UJWJ'ChryMoRKorTr30wKansas City Va Medical Ce161KenCDVer64gKentuckyCascades Endoscopy Center LLCiPeLaurencKoreConnally Memorial Medic77a40Maryville IncorpoIsidoro Donn5w32975d0Bay Area Center Sacred Heart Health Sys30GrayExecutive Park Surgery CenPatsy Bal5Katrinka BlazinAmanda PeaSwazilKeWilhe098AvoSarah D Culbertson Memorial HospitElonda EpimenioOkey DTerRica 098HenniNew Mexico Orthopaedic Surgery Center LP Dba [place] Ort96045Eri30816109Shelbie A2Marland Kitchen1Byrd Regional HospKentuckyiKentuckLoreHomeroCharm Barges57DuanPeggyann29Kentuckyh814Kentucky1MNorth Okaloosa Medical CentZOKentuckyXWR'UKentucky90161785Kentucky309 Brownfield Regional MZOXWMercy Hospital HealdtKentuckyonRMarland McKentucky74ChristKentuckyiane [place]1303-7ChW48UJWJ'ChrHollKorTr64wSelect Specialty Hospital Danv161KanNVer61gKentuckyAnna Hospital Corporation - Dba Union County HospitaliPeLaurencKoreM Health98 40Mckenzie County Healthcare SyIsidoro Donn47w38605d0Northwest Kansas SuPatsy Bal3Katrinka BlazinAmanda PeaSwazilKeWilhe098AvoClifton Springs HospitElonda EpimenioOkey DTerRica 098Overland PaValdese96045Eri30816109Shelbie Carr(403Marland Kitchen)Pacific Endo SurgicaKentuckylKentuckLoreHomeroCharm Barges625DuanePeggyannCh33K[place](682)[place]1MWekiva SprinZOKentuckyXWR'U16ShKentuckyarl S1(754Collen220Kentucky65161396K[place]ucky(347)Presence Lakeshore Gastroenterology Dba Des Plaines EndZOXWMedical City Of AllianKentuckyceRMarland McKentucky4ChristKentuckyiane Kentucky1902 3ChW24UJWJ'ChryWalterKorTr68wNortheast Georgia Medical Center,161KentuFredericChoVer49gKentuckyThe Ridge Behavioral Health SystemiPeLaurencKoreHosp San Carlos85 40Ambulatory Endoscopy Center Of MarIsidoro Donn34w65435d0Community Hospital Onaga LPatsy Bal5Katrinka BlazinAmanda PeaSwazilKeWilhe098AvoButte County PElonda EpimenioOkey DTerRica 098BeattyvilVirtua West Jer96045Eri30816109Shelbie A432Marland Kitchen-[facility] Of NatKentuckycKentuckLoreHomeroCharm BargesDuanePeggyann26Kentuckyh480Kentucky1MThe Betty Ford CentZOKKentuckye2161482Kentucky910-Mark Twain St. JoseZOXWKaiser Fnd Hosp - RiversiKentuckydeRMarland McKentucky50ChristKentuckyiane Kentucky1559-0ChW92UJWJ'ChryKaMoKorTr68wGardeRooThibVer47gKentuckyKaiser Fnd Hosp - South SacramentoiPeLaurencKoreGrand Teton Surgical C61e40Northeast [place] Health Services Trinity HosIsidoro Donn85w5135d0Havasu Regional Medical Cen30Outpatient Womens And CPatsy BalKatrinka BlazinAmanda PeaSwazilKeWilhe098AvoCenterpointe HospitElonda EpimenioOkey DTerRica 098BellevMain [id number] Carr(925)Christina Kitchen Dcr Surgery Center LLShelly Ru1Kentucky6KentuckLoreHomerCharm BargesDuanePeggyann37Kentuckyh215Kentucky1MMt Pleasant Surgical CentZOKentuckyXWR'U16ShKKentucky69161467Kentucky640-Norton BrownsZOXWNorthlake Surgical Center KentuckyLPRMarland McKentucky66ChristKentuckyiane Kentucky1813-3ChW91UJWJ'ChryMelLKorTr52wNorthwest Medical Center - [facility] Women'S Hosp161Kentucky00987131Roda ShutterswPresence Mountain LakFVer44gKentuckyFroedtert South Kenosha Medical CenteriPeLaurencKoreMillennium Healthcare Of Cl50i40Uc Regents Dba Ucla Health Pain Management ThousandIsidoro Donn22w77995d0Mercy Hospital 30Cross Surgery Christina Bal3Katrinka BlazinAmanda PeaSwazilKeWilhe098AvoBailey Square Ambulatory Surgical Center LElonda EpimenioOkey DTerRica [id number] Carr(854Marland Kitchen)Virt[facility] Of BurliKentuckynKentuckLoreHomeroCharm Barges52DuanePeggyann68Kentuckyh913Kentucky1MElliot 1 Day Surgery CentZOKentuckyXWR'U16ShKentuckyKentucky38161562Kentucky[address] JoZOXWAurora Charter OKentuckyakRMarland McKentucky49ChristKentuckyiane Kentucky1(250)5ChW59UJWJ'ChClear KorTr30wEast Coast Surgery161Kentucky009871One LMiVer99gKentuckyHealthcare Enterprises LLC Dba The Surgery CenteriPeLaurencKoreBellevue Ambulatory Surge56r40Lifecare Hospitals Of [place] - Mcallen Isidoro Donn20w36015d0Red Cedar Surgery Center P30Upper EloPoint Of Rocks Surgery4Patsy Bal6Katrinka BlazinAmanda PeaSwazilKeWilhe098AvoUnity Medical CentElonda EpimenioOkey DTerRica 098Watts [id number] Carr(617Marland Kitchen)TowKentuckynKentuckLoreHomero5Charm Barges5DuanePeggyannCh80Kentucky(432)Kentucky1MBeverly Campus Beverly CampZOKentuckyKentucky20161518Kentucky559 Center For BehaviZOXWThrockmorton County Memorial HospitKentuckyalRMarland McKentucky20ChristKentuckyiane Kentucky1587-7ChW45UJWJ'ChryTyndaBKorTr47wTexas Health Center For Diagnostics & Surgery P161Kentucky009871MOkaVer43gKentuckyThe Physicians Centre HospitaliPeLaurencKoreWausau Surge55r40Stephens Memorial HosIsidoro Donn43w8845d0Midlands Endoscopy Center 30PineyEncompass Health RehPatsy BalKatrinka BlazinAmanda PeaSwazilKeWilhe098AvoCitizens Medical CentElonda EpimenioOkey DTerRica 0[id number] A4Marland 2174I458161161161413-802-36221778605CaLeadville Ver50gKentuckyEndoscopy Center Of DelawareiPeLaurencKoreKindred Hospital - L79o40The Medical Center At CIsidoro Donn63w6745d0Edmond -Amg Specialty Hospi30PauAdams Memori16aChMBell Christina Bal2Katrinka BlazinAmanda PeaSwazilKeWilhe098AvoNiagara Falls Memorial Medical CentElonda EpimenioOkey DTerRica [id number] Carr(802Marland Kitchen)Lds HospitaShelly Ru76Kore71a962959Kentucky2KentuckLoreHomeCharm Barges210DuanePeggyannCh59Kentucky(916)[place]1MCitizens Medical CentZOKentuckyXWR'U16ShKentuckyarl S1(760)Collen210-Kentucky46161321K[place](773)Physicians Choice SuZOXWTexas Midwest Surgery CentKentuckyerRMarland McKentucky15ChristKentuckyiane Kentucky1(416)3ChW37UJWJ'ChrySauOkKorTr55wSalinas Surgery Ce161KentWest Bay Ver24gKentuckySouthern Coos Hospital & Health CenteriPeLaurencKoreMclaren Northern6 40Valley Ambulatory Surgery CIsidoro Donn61w83435d0Good Shepherd Medical Center - Lin30HydAlegent HPatsy Bal1Katrinka BlazinAmanda PeaSwazilKeWilhe098AvoMidwest Digestive Health Center LElonda EpimenioOkey DTerRica [id number] Carr(843Marland Kitchen)[facility] Alvin L Krakau Comm Mtl Health KentuckyKentuckLoreHomeroCharm BargesDuanePeggyann55Kentuckyh680Kentucky1MMount Sinai Beth IsraZOKentuckyXKentucky52161307Kentucky36Twin Lakes Regional MZOXWSempervirens P.H.KentuckyF.RMarland McKentucky26ChristKentuckyiane Kentucky1713 6ChW50UJWJ'ChrySellersvKorTr50wMountaiEstill SAsVer40gKentuckyCedars Surgery Center LPiPeLaurencKoreFirst Texas1 40Greenwich Hospital AssocIsidoro Donn62w7215d0North River Surgical Center 30Colorado EPatsy Bal1Katrinka BlazinAmanda PeaSwazilKeWilhe098AvoSanford Health Dickinson Ambulatory Surgery CElonda EpimenioOkey DTerRica 098Central [id number] Carr(662)Christina Kitchen MartinsbuKentuckyrKentuckLoreHomero7Charm BargeDuanePeggyann25Kentuckyh351Kentucky1MSt. Luke'S Lakeside HospitZOKentucky35161521Kentucky65Us Army HospitaZOXWNorthern Utah Rehabilitation HospitKentuckyalRMarland McKentucky68ChristKentuckyiane Kentucky1223-4ChW69UJWJ'ChrySchuyleGlenn KorTr16wMount Sinai Beth Is161KentuckSilLexiVer47gKentuckyCheyenne River HospitaliPeLaurencKoreKindred Hospital - Las Vegas (Flaming47o40Community Regional Medical Center-FIsidoro Donn37w21785d0Hudson Crossing Surgery Cen30PaDenton Regional AmbulatorPatsy Bal6Katrinka BlazinAmanda PeaSwazilKeWilhe098AvoCape Cod & Islands Community Mental Health CentElonda EpimenioOkey DTerRica 098PanhandAdena 96045Eri30816109Shelbie A581Marland Kitchen-Kunesh Eye SurgeryKentucky KentucLoreHomeCharm Barges76DuanePeggyann67Kentuckyh854Kentucky1MEndoscopy Center Of Coastal Georgia LZOKentucky56161505Kentucky539-Avera Queen Of PZOXWMid Columbia Endoscopy Center LKentuckyLCRMarland McKentucky35ChristKentuckyiane Kentucky1412-0ChW3UJWJ'ChryMInKorTr62wMercy Hospital Of DevDaArlington HeVer63gKentuckyAtrium Health UnioniPeLaurencKoreSurgery Center Of Lawr32e40University Hospitals Samaritan MeIsidoro Donn5w6835d0Johnson Memorial Hospi30ButleMilwaukee Cty Behavior36aChMGrantLiPatsy Bal3Katrinka BlazinAmanda PeaSwazilKeWilhe098AvoHiLLCrest Hospital SouElonda EpimenioOkey DTerRica 098El AdoEncompass Health Rehabilitation 96045Eri30816109Shelbie A262Marland Kitchen-Bayfront HealKentuckytKentuckLoreHomero7Charm Barges74DuanePeggyann6Kentuckyh936Kentucky1MWarm Springs Medical CentZOKentuckyXKentucky65161306Kentucky787-Central Valley SuZOXWCamden Clark Medical CentKentuckyerRMarland McKentucky15ChristKentuckyiane Kentucky1339-3ChW5UJWJ'ChryPearlHicKorTr94wGulf Coast Outpatient Surgery Center LLC Dba Gulf Coast OutpatienVer11gKentuckyiP40The Surgical SuiteIsidoro Donni915ng0East SidePatsy Bal84RegKentucky1610WilheminCommunity HospitElonda Epimenio FOkey DTerRicaKentucky KentuckLoreHomero101Charm Barges76wDuanePeggyannC72Kentuckyh(920Kentucky1MSt Louis Specialty SurgiKentucky28161525Kentucky775 Berkshire Cosmetic And Reconstructive SurgeZOXWBaylor Scott & White Medical Center - SunnyvaKentuckyleRMarland McKentucky17ChristKentuckyiane Kentucky1202-0ChW7UJWJ'ChrRidgeKorTr65wNew [place] Orthopaedic Surgery Center LP Dba [place] Orthopaedic Surgery Ce16WDVer67gKentuckySanta Clara Valley Medical CenteriPeLaurencKoreBaylor Scott & White Medical Center - 68S40Encompass Health Rehabilitation Hospital Of Northwest TIsidoro Donn62w245d0Sonoma Valley Hospi30Adventhealth E3Patsy Bal5Katrinka BlazinAmanda PeaSwazilKeWilhe098AvoCharlie Norwood Va Medical CentElonda EpimenioOkey DTerRica 098EarlingtWomen'S [id number] Carr(859Marland Kitchen)96Th Medical Group-Eglin HoKentuckysKentuckLoreHomero2Charm Barges65DuanePeggyannC31Kentuckyh(503Kentucky1MIdaho Endoscopy Center LZOKentuckyXWR'U16ShKentuKentucky1316141Kentucky323 Bear Valley CommuZOXWEncompass Health Rehabilitation HospitKentuckyalRMarland McKentucky45ChristKentuckyiane Kentucky1418-2ChW6UJWJ'ChryEast KorTr72wCj Elmwood Partners16SquareElleVer69gKentuckyBaylor Medical Center At WaxahachieiPeLaurencKoreHelena Regional Medic63a40University Of Toledo Medical CIsidoro Donn94w23005d0Coffeyville Regional Medical Cen30MaeAssension Sacred Heart HPatsy Bal2Katrinka BlazinAmanda PeaSwazilKeWilhe098AvoIndiana Endoscopy Centers LElonda EpimenioOkey DTerRica 098Cross MountaOs96045Eri30816109Shelbie A925Marland Kitchen-[facility] Neuro[facility] BehavioraShelly Ru146Kore22a962959Kentucky8KentuckLoreHomero6Charm Barges355DuanePeggyann15Kentuckyh239Kentucky1MSelect Specialty Hospital - Cleveland FairhiZOKentuckyXWR'U16ShKentuckyKentuckya8161613Kentucky564-Specialty HosZOXWAspirus Iron River Hospital & CliniKentuckycsRMarland McKentucky30ChristKentuckyiane Kentucky1657-6ChW75UJWJ'ChryDaEdKorTrGFredVer29gKentuckyGastroenterology EastiPeLaurencKoreLittle River H14e40Tampa Bay Surgery Center Dba Center For Advanced Surgical SpeciIsidoro Donn56w5555d0Kindred Hospital BosPatsy Bal7Katrinka BlazinAmanda PeaSwazilKeWilhe098AvoMethodist Medical Center Of Oak RidElonda EpimenioOkey DTerRica 098Arlingt96045Eri30816109Shelbie Carr(984Marland Kitchen)Emerald Surgical CenterKentucky KentuckLoreHomeroCharm Barges626DuanePeggyann4Kentuckyh404Kentucky1MSouth Pointe Surgical CentZOKentuckyXWR'U16ShKentuckyarl SKentucky60161256Kentucky774-Santa Cruz SZOXWAstra Sunnyside Community HospitKentuckyalRMarland McKentucky15ChristKentuckyiane Kentucky1509-5ChW80UJWJ'ChryCeAKorTr23wMadelia Community HosVer36gKentuckyManati Medical Center Dr Alejandro Otero LopeziPeLaurencKoreOur Lady Of The Angels41 40Indiana Spine HospitalIsidoro Donn98w6605d0Surgery Center Of Chesapeake 30DallaOur Lady Of Christina Bal2Katrinka BlazinAmanda PeaSwazilKeWilhe098AvoVibra Hospital Of Northern CalifornElonda EpimenioOkey DTerRica 098WahWoolfson Ambula96045Eri30816109Shelbie Carr(850Marland Kitchen)Houston MetKentuckyhKentuckLoreHomerCharm Barges24DuanePeggyann36Kentuckyh678Kentucky1MPeacehealth Peace Island Medical CentZOKentuckyXWR'U16ShKentuckyarl S1782Collen(45Kentucky55161916Kentucky(445)John Peter SZOXWThe Gables Surgical CentKentuckyerRMarland McKentucky63ChristKentuckyiane Kentucky1203-1ChW59UJWJ'ChryLesteSticKorTr5wSelect Specialty Hospital Laurel Highlands161KenKentuck647852w5358w3Ch662Kentucky1MAnne Arundel Digestive Centerig16Sharl S1763Collen512-783-5671059Kentucky63ZOXWMountrail County Medical CentKentuckyerRMarland McKentucky30alpKentucky1914-7ChrysChryWHaKorear68wNashville Gastrointestinal Specialists LInvCVer39gKentuckyNashua Ambulatory Surgical Center LLCiPeLaurencKoreLifecare Hospitals Of South [place] - Mcal33l40Arkansas Endoscopy CentIsidoro Donn61w77605d0Presbyterian Hospital 30AArboPatsy Bal5Katrinka BlazinAmanda PeaSwazilKeWilhe098AvoJames E. Van Zandt Va Medical Center (AltoonElonda EpimenioOkey DTerRic96045Eri30816109Shelbie A785Marland Kitchen-[facility] Carbon CountShelly Ru26Kore19a96295639-0TKentuckyeKentuckLoreHomeCharm Barges22DuanePeggyann65Kentuckyh613Kentucky1MAlbany Memorial HospitZOKKentucky44161296Kentucky44Memorial Hermann Surgery Center ZOXWKidspeace Orchard Hills CampKentuckyusRMarland McKentucky32ChristKentuckyiane Kentucky1719-1ChW50UJWJ'ChryKeyBrateKorTr67wMid Peninsula Endos1SWillowVer69gKentuckyTexoma Outpatient Surgery Center InciPeLaurencKoreMed City Dallas Outpatient Surgery 61C40Oceans Behavioral Hospital Of AlexIsidoro Donn7w75365d0Ocean State Endoscopy Cen30Oliver SSelect Specialty HospitaPatsy Bal7Katrinka BlazinAmanda PeaSwazilKeWilhe098AvoChase Gardens Surgery Center LElonda EpimenioOkey DTerRica 098WolfdaStarpoint Surge96045Eri30816109Shelbie A315Marland Kitchen-Uc Health Pikes Peak RegionKentuckyaKentuckLoreHomerCharm Barges94DuanePeggyann29Kentuckyh817Kentucky1MAtrium Health UniversiZOKentuckyXWR'U1Kentucky59161376Kentucky(954)Timpanogos RegiZOXWWeatherford Regional HospitKentuckyalRMarland McKentucky60ChristKentuckyiane Kentucky1737-6ChW56UJWJ'ChryHHansKorTr50wWhite Fence Surgical Su161KeKentuckynKentuckLoreHomeroCharm Barges630w58Ch(608Kentucky1MLochKentucky701612Regency [facility] Company ZOXWWaupun Mem HspKentuckytlRMarland McKentucky55Christiane [place]1713-6ChWUJWJ'ChrVKoreai58wSt Vincent General HospitNewVer57gKentuckySelect Speciality Hospital Of MiamiiPeLaurencKoreEmanuel Medic26a40Westside Surgical HosIsidoro Donn32w65675d0Baylor University Medical Cen30AppCarolinas Medical Center FPatsy Bal4Katrinka BlazinAmanda PeaSwazilKeWilhe098AvoMarion Il Va Medical CentElonda EpimenioOkey DTerRica 098Desert PalB96045Eri30816109Shelbie A651Marland Kitchen-Beltway Surgery Centers LLC Dba Eagle Highlands Surgery KentuckyeKentuckLoreHomero8Charm Barges51DuanePeggyannC84Kentuckyh(440Kentucky1MOur Childrens HouZOKentuckyXWR'U16ShKentuckyaKentucky77161312K[place]ucky(226)Christus Surgery Center ZOXWLebauer Endoscopy CentKentuckyerRMarland McKentucky33ChristKentuckyiane Kentucky1223-1ChW38UJWJ'ChWaKorTr69wHillside Diagnostic And Treatment Center161KenPueblo Ver78gKentuckyEllenville Regional HospitaliPeLaurencKoreHind General Hos20p40Capital Regional Medical CIsidoro Donn55w5285d0The Emory Clinic 30FrPatsy Bal1Katrinka BlazinAmanda PeaSwazilKeWilhe098AvoBehavioral Health HospitElonda EpimenioOkey DTerRica 098Sylvan LaBaystate 96045Eri30816Kentucky1KentuckLoreHomero6Charm Barges422wDuanePeggyann31Kentuckyh859Kentucky1MBrandywine Valley Endoscopy CentZOKentuckyXWR'U16ShKentuckyarl S16Kentucky36161183Kentucky657-Pipeline [facility] Dba Westlake CommuZOXWBeebe Medical CentKentuckyerRMarland McKentucky65ChristKentuckyiane Kentucky1905-1ChW70UJWJ'ChrZwiKorTr102wMcbride OrthopedPalNoVer59gKentuckyWest Jefferson Medical CenteriPeLaurencKoreMercy 57W40[facility] HosIsidoro [id number] [place] Hospi30ThienCoral GaPatsy Bal2Katrinka BlazinAmanda PeaSwazilKeWilhe098AvoNorthridge Outpatient Surgery Center IElonda EpimenioOkey DTerRica 098Gold [id number] A845Marland Kitchen-Endo Group LLC Dba [place] SurgiceneteShelly Ru346KorKentuckyeKentuckLoreHomero2Charm Barges464DuanePeggyann52Kentuckyh941Kentucky1MBrookhaven HospitZOKentucKentucky64161203Kentucky812-Beaumont HosZOXWRaleigh General HospitKentuckyalRMarland McKentucky79ChristKentuckyiane Kentucky1(347) 0ChW28UJWJ'ChryDoGouldKorTr87wMedina Regional HoHoVer53gKentuckyKishwaukee Community HospitaliPeLaurencKoreAdvanced Surge63r40Palms Surgery CenteIsidoro Donn52w104425d0Ms Band Of Choctaw Hospi30DilGeorgia Region10aChMBull MPatsy Bal6Katrinka BlazinAmanda PeaSwazilKeWilhe098AvoStar View Adolescent - P HElonda EpimenioOkey DTerRica [id number] Carr(337)Christina Kitchen [facility] PShelly Ru846Kore51a96295(678)Carr KentuckLoreHomeroCharm Barges452DuanePeggyannC30Kentuckyh(817Kentucky1MRiverside Medical CentZOKentuckyXWR'UKentucky12161177Kentucky(807)[facility] MZOXWNew Jersey State Prison HospitKentuckyalRMarland McKentucky52ChristKentuckyiane Kentucky1603-6ChW20UJWJ'ChryHarbor SMount CaKorTr33wLifecare Hospitals Of [facility] [place] - Mcallen N161KentuWarm Mineral SpVer48gKentuckySanta Rosa Memorial Hospital-MontgomeryiPeLaurencKoreCommunity Memorial Hospital-San Bue40n40WIsidoro Donn66w8275d0[facility] TePatsy Bal1Katrinka BlazinAmanda PeaSwazilKeWilhe098AvoEndoscopy Center At Ridge Plaza Christina EpimenioOkey DTerRica [id number] Carr(763)Christina Kitchen Erlanger BledsoShelly Ru296Kore59a960001Cha17r781itKentuckyKentuckLoreHomeroCharm Barges353DuanePeggyann59Kentuckyh562Kentucky1MSouthern Lakes Endoscopy CentZOKentuckyXWR'U16ShKentuckyarKentuckyl3161591Kentucky(727)Encompass Health Rehabilitation HospitZOXWOsceola Community HospitKentuckyalRMarland McKentucky38ChristKentuckyiane Kentucky1308-3ChW76UJWJ'ChryLa CDudleyvKorTr41wSaint Clare'S HoBearRosVer60gKentuckyDevereux Treatment NetworkiPeLaurencKoreNorthern Virginia Mental Health 17I40Hss Asc Of [facility] D[facility] For Special SuIsidoro Donn50w825d0Metro Health Medical Cen30ArrPatsy Bal6Katrinka BlazinAmanda PeaSwazilKeWilhe098AvoGateways Hospital And Mental Health CentElonda EpimenioOkey DTerRica [id number] A772Marland Kitchen-[facility] Dba Endoscopy CenKentuckytKentuckLoreHomeroCharm Barges566DuanePeggyann81Kentuckyh639Kentucky1MGreenwood Regional Rehabilitation HospitZOKentuckyXWRKentucky6816129Kentucky709-Ren[facility] MZOXWWesterville Endoscopy Center LKentuckyLCRMarland McKentucky33ChristKentuckyiane Kentucky1956 7ChW50UJWJ'ChryWestern SMarion HeiKorTr6wHaven Behavioral Serv161KentucWesBondVer58gKentuckyEast Central Regional Hospital - GracewoodiPeLaurencKoreNorth Orange County Surge64r40Citizens Baptist Medical CIsidoro Donn33w95625d0Kaiser Permanente P.H.F - Santa Cl30EnteSequoyaPatsy Bal3Katrinka BlazinAmanda PeaSwazilKeWilhe098AvoBryan W. Whitfield Memorial HospitElonda EpimenioOkey DTerRica 098ColstrLee [id number] A3Marland Kitchen0Citizens Baptist Medical CenteShelly Ru316KoKentuckyrKentuckLoreHomero6Charm Barges33DuanePeggyann22Kentuckyh228Kentucky1MMedstar Union Memorial HospitZOKentuckyXWR'U16ShKenKentucky1116153Kentucky(657) [facility] SuZOXWSt. Elizabeth EdgewoKentuckyodRMarland McKentucky60ChristKentuckyiane Kentucky1403-6ChW25UJWJ'ChryMormoManalKorTr25wOlive Ambulatory Surgery Center Dba North Campus EVer60gKentuckyBellevue HospitaliPeLaurencKoreKirkbri22d40Doctors HosIsidoro Donn70w107945d0Access Hospital Dayton, 30LawrencBarnes-Jewish Hospital - PsyPatsy Bal3Katrinka BlazinAmanda PeaSwazilKeWilhe098AvoOrthocare Surgery Center LElonda EpimenioOkey DTerRica [id number] A365Marland Kitchen-[facility] [place] SpKentuckyiKentuckLoreHomero7Charm Barges75DuanePeggyannCh78Kentucky(718)K[place]ucky1MVista Surgical CentZOKentuckyXWR'U16ShKeKentucky33161524Kentucky(952)Encompass Health Rehabilitation HospitaZOXWFort Hamilton Hughes Memorial HospitKentuckyalRMarland McKentucky34ChristKentuckyiane Kentucky1574-0ChW56UJWJ'ChryBeaver PangKorTr42wCoshocton County Memorial HosPowelOtterVer20gKentuckyCleveland-Wade Park Va Medical CenteriPeLaurencKoreNovant Health Matthews Surge94r40Caldwell Memorial HosIsidoro Donn35w16535d0Desert Sun Surgery Center 30GlPatsy Bal5Katrinka BlazinAmanda PeaSwazilKeWilhe098AvoCommunity Hospitals And Wellness Centers MontpeliElonda EpimenioOkey DTerRica 098HiawatOak [id number] A5Marland Kitchen0[facility] Associates InShelly RuKentucky6KentuckLoreHomeroCharm Barges6DuanePeggyann47Kentuckyh210Kentucky1MGundersen Tri County Mem HspZOKentuckyXWR'U16Kentucky72161611Kentucky438-Orthoarizona Surgery CZOXWMid-Jefferson Extended Care HospitKentuckyalRMarland McKentucky76ChristKentuckyiane Kentucky1820-1ChW73UJWJ'ChryLakMcClenney TKorTr85wPalmetto Lowcountry BehPalmetto EPolkVer3gKentuckySycamore Shoals HospitaliPeLaurencKoreWesterville Medic25a40Thedacare Medical Center Wild Rose Com Mem HospitIsidoro Donn6w67035d0Nort[facility]ulatory [facility] Center 30HKendall Christina Bal1Katrinka BlazinAmanda PeaSwazilKeWilhe098AvoCrittenton Children'S CentElonda EpimenioOkey DTerRica 098NewbuSsm [id number] Carr(215Marland Kitchen)[facility] Dba [place] Eye Surgical CenteShelly Ru286Kore49a96295(802) 4Tera MaKentuckytKentuckLoreHomeroCharm Barges54DuanePeggyann60Kentuckyh702Kentucky1MFillmore Community Medical CentZOKentuckyXWR'U16ShKentuckyarl S1709Collen(281)Kentucky90161626Kentucky214-Yuma RehabilitaZOXWJefferson Washington TownshKentuckyipRMarland McKentucky54ChristKentuckyiane Kentucky1757-7ChW2UJWJ'ChryCornwall-on-MontgoKorTr22wPotomac VallFVer67gKentuckyDanville State HospitaliPeLaurencKoreValencia Outpatient Surgical Center Pa68r40Christus Spohn Hospital KlIsidoro Donn27w5305d0BlPatsy Bal2Katrinka BlazinAmanda PeaSwazilKeWilhe098AvoBon Secours St. Francis Medical CentElonda EpimenioOkey DTerRica 098PerlWashington County [id number] A3Marland Kitchen5Capital Region Ambulatory SurgeryKentucky KentucLoreHomeroCharm BargesDuanePeggyann34Kentuckyh561Kentucky1MThe Surgical Center At Columbia Orthopaedic GroupKentucky58161654Kentucky(336)C[address] St JosZOXWDoctors Center Hospital- ManaKentuckytiRMarland McKentucky69ChristKentuckyiane Kentucky1419-4ChW28UJWJ'ChryBCanyon CKorTr45wBeckleyVer32gKentuckyiP40Eastern [place] Endoscopy CenteIsidoro Donni6268ng0Premier [facility]ty [facility] El P30865Greeley County HSaddlMetrPatsy Bal64RegKentucky1610WilheminCastle Rock Surgicenter LElonda EpimenKentuckyiKentuckLoreHomeroCharm Barges43DuanePeggyannCh62Kentucky(786)Kentucky1MScripps Memorial Hospital - La JolZOKentuckyXWR'U16ShKentucky65161303Kentucky904-Young ZOXWBloomfield Asc LKentuckyLCRMarland McKentucky60ChristKentuckyiane Kentucky1772ChW58UJWJ'ChryOpeStKorTr80wCovenant Medical Center, Co161LocVer90gKentuckyEndoscopy Center Of Northern [place] LLCiPeLaurencKoreUpmc Sha45d40Fairfield Memorial HosIsidoro Donn48w72605d0New Han[facility] Regional Medical Cen30LBaxter Regional MePatsy Bal6Katrinka BlazinAmanda PeaSwazilKeWilhe098AvoHosp Universitario Dr Ramon Ruiz ArnElonda EpimenioOkey DTerRica 098Noroton HeighLone Star [id number] A218Marland Kitchen-PetersKentucky KentuckLoreHomeroCharm Barges55DuanePeggyann34Kentuckyh403Kentucky1MCentro De Salud Susana Centeno - ViequZOKentuckyXWR'U16ShKentuckKentucky2116141Kentucky443-Brookings ZOXWChildren'S Hospital Of Orange CounKentuckytyRMarland McKentucky47ChristKentuckyiane Kentucky1(210)2ChW78UJWJ'ChryHuWeKorTr61wUnitypoint Health-Meriter Child And Adolescent PsColleSaVer60gKentuckyAlaska Spine CenteriPeLaurencKoreMacon Outpatient Su26r40Mercy Regional Medical CIsidoro Donn77w47285d0Saint Joseph Mercy Livingston HospPatsy Bal8Katrinka BlazinAmanda PeaSwazilKeWilhe098AvoFrazier Rehab InstituElonda EpimenioOkey DTerRica [id number] A281Marland Kitchen-Pawhuska HospitaKentuckySKentuckLoreHomerCharm Barges7DuanPeggyann53Kentuckyh518Kentucky1MWayne HospitZKentucky47161404Kentucky307-River Valley BehaZOXWChenango Memorial HospitKentuckyalRMarland McKentucky108ChristKentuckyiane Kentucky1509ChW40UJWJ'ChryBell GKieKorTr73wThe Outpatient Center OForestCatalpa CVer51gKentuckyWesterville Endoscopy Center LLCiPeLaurencKoreShodair Childrens17 40Jefferson Endoscopy Center AtIsidoro Donn34w76945d0Southwest Health Center 30SaltoHoly Redeemer Hospital & Med62iChMFourth Corner NeurosurgicalPatsy Bal10Katrinka BlazinAmanda PeaSwazilKeWilhe098AvoCarolinas Medical CentElonda EpimenioOkey DTerRica [id number] Carr(226Marland Kitchen)Medical Arts Surgery Center At SouKentuckytKentuckLoreHomero5Charm Barges343wDuanePeggyann4Kentuckyh204Kentucky1MAscension Macomb Oakland Hosp-Warren CampZOKentuckyXWR'U16ShKentuckyarl S14Kentucky56161727Kentucky80Edgemoor GeriaZOXWKern Medical CentKentuckyerRMarland McKentucky11ChristKentuckyiane Kentucky1(757)8ChW14UJWJ'ChrNKorTr27wHahnemann University HospCamMataVer22gKentuckyLoveland Surgery CenteriPeLaurencKoreEndoscopy Center Of North 81B40Fairview Regional Medical CIsidoro Donn60w86795d0Landmark Surgery Christina Bal3Katrinka BlazinAmanda PeaSwazilKeWilhe098AvoHighlands Regional Medical CentElonda EpimenioOkey DTerRica 098RedfieAdventist Health And [id number] Carr(587)Christina Kitchen Endoscopy Center Of Toms RiveKentuckySKentuckLoreHomeroCharm Barges74DuanePeggyannC25Kentuckyh(367Kentucky1MCentra Specialty HospitZOKentuckyXWR'U16ShKentuckKentucky89161187Kentucky63Transsouth Health Care Pc Dba Ddc SZOXWCts Surgical Associates LLC Dba Cedar Tree Surgical CentKentuckyerRMarland McKentucky83ChristKentuckyiane Kentucky1434ChW71UJWJ'ChrDaingerfKorTr94wGolden Gate Endoscopy Center1CleaUnionVer74gKentuckyGastrointestinal Associates Endoscopy CenteriPeLaurencKoreUrlogy Ambulatory Surgery C37e40Denville Surgery CIsidoro Donn12w86215d0Lifecare Hospitals OfPatsy Bal8Katrinka BlazinAmanda PeaSwazilKeWilhe098AvoCrittenden Hospital AssociatiElonda EpimenioOkey DTerRica [id number] A331Marland Kitchen-Reconstructive Surgery Center [facility] [place] InShellKentuckyyKentuckLoreHomero6Charm Barges474DuanePeggyannC42Kentuckyh(608Kentucky1MDouglas Gardens HospitZOKentuckyXWR'U16ShKKentucky8316169Kentucky(424)Dch Regional MZOXWNortheast Medical GroKentuckyupRMarland McKentucky27ChristKentuckyiane Kentucky1(918)4ChW88UJWJ'ChryEdmPKorTr59wCollege Heights Endoscopy Center161Lake VGuntersVer52gKentuckyPioneers Memorial HospitaliPeLaurencKoreBeth Israel Deaconess Hospital 15-40Beartooth Billings CIsidoro Donn74w4565d0Dignity Health St. Rose Dominican [place] Christina Bal4Katrinka BlazinAmanda PeaSwazilKeWilhe098AvoCrescent City Surgical CentElonda EpimenioOkey DTerRica 098BrinklChristus [id number] Carr(220Marland Kitchen)Kinston KentuckyMKentuckLoreHomeroCharm BargesDuanePeggya34KentuckynnCh3Kentucky1MMercy Medical Center-DyersvilZOKentKentucky87161341Kentucky873ZOXWMarin General HospitKentuckyalRMarland McKentucky59ChristKentuckyiane Kentucky1(819)4ChW43UJWJ'ChryShaktoKorTr72wJefferson Surgery Center Cherry 161KWest End-CobbVer62gKentuckyBaylor Scott And White Hospital - Round RockiPeLaurencKoreAdventist Health 57C40Baptist Health Medical Center Van Isidoro Donn90w6375d0Danbury Hospi30CaliforniaCommunity Health Network RehabiPatsy Bal7Katrinka BlazinAmanda PeaSwazilKeWilhe098AvoLake Whitney Medical CentElonda EpimenioOkey DTerRica 098Wabasso [id number] Carr(331Marland Kitchen)[facility] Sanfernando De CarolinShelly KentuckyRKentuckLoreHomeroCharm Barges35DuanePeggyann74Kentuckyh587Kentucky1MAmbulatory Surgery Center At LZOKentuckyXWR'U16ShKKentucky12161305Kentucky(878) [facility] OZOXWVidant Roanoke-Chowan HospitKentuckyalRMarland McKentucky67ChristKentuckyiane Kentucky1(214) 2ChW58UJWJ'ChryFayettLa Grange KorTr32wSelect Specialty Hospital - Dallas (GaWest ValleNorthVer55gKentuckyOrthopaedic Surgery Center Of San Antonio LPiPeLaurencKoreJames H. Quillen Va Medic81a40Mainegeneral Medical CIsidoro Donn53w4595d0Peacehealth Southwest MePatsy Bal3Katrinka BlazinAmanda PeaSwazilKeWilhe098AvoFlushing Endoscopy Center LElonda EpimenioOkey DTerRica 098TrentMemorial [id number] A409Marland Kitchen-[place] SurgeKentuckyrKentuckLoreHomero6Charm Barges58wDuanePeggyann58Kentuckyh206Kentucky1MAdvanced Eye Surgery Center ZOKentuckyXWR'U16ShKentuckyaKentucky441611057Kentucky956-Endosurgical CentZOXWPhoenixville HospitKentuckyalRMarland McKentucky24ChristKentuckyiane Kentucky1325-5ChW41UJWJ'ChryLakClKorTr25wPatients Choice Medical Ce161Kentucky009871CoEast BakersVer98gKentuckyRegional Behavioral Health CenteriPeLaurencKoreStockdale Surgery C31e40Duluth Surgical SuiteIsidoro Donn32w66205d0St. Elias Specialty Hospi30ScaTexas Health Harris MethPatsy Bal2Katrinka BlazinAmanda PeaSwazilKeWilhe098AvoEncompass Health Rehabilitation Hospital Of Altamonte SprinElonda EpimenioOkey DTerRica [id number] A772Marland Kitchen-Osceola Community HospitaShelly Ru496Kore61a9629Kentucky5KentuckLoreHomerCharm Barges480wDuanePeggyannCh69Kentucky(808)Kentucky1MCleveland Eye And Laser Surgery Center LZOKentuckyXWR'U16ShKentuckyarl SKentucky51161462Kentucky236-Cornerstone RegiZOXWMidmichigan Medical Center-ClaKentuckyreRMarland McKentucky23ChristKentuckyiane Kentucky1831-3ChW22UJWJ'ChryKeLa CKorTr53wCoastal Endo161KenWVer41gKentuckyCaplan Berkeley LLPiPeLaurencKoreDavis Medic24a40Miami Va Medical CIsidoro Donn33w4585d0Easton Hospi30Eastern ShPatsy Bal4Katrinka BlazinAmanda PeaSwazilKeWilhe098AvoLucile Salter Packard Children'S Hosp. At StanfoElonda EpimenioOkey DTerRica 098AtlanThe Eye [id number] A810Marland Kitchen-Mississippi Eye Surgery CenteShelKentuckylKentuckLoreHomeroCharm Barges526DuanPeggyann23Kentuckyh458Kentucky1MInova Fairfax HospitZOKentuckyKentucky6316170Kentucky360-Emory DecZOXWClermont Ambulatory Surgical CentKentuckyerRMarland McKentucky79ChristKentuckyiane Kentucky1587-1ChW46UJWJ'ChryStratton MoMoKorTr47wSanta Ros41aKentucIsidoro Donni38ng09Bailey MedicalLaureMercy General HospiKentuckytLKentuckyePatsy Balti1610WilheminHighlands Regional Medical CenteraEye Surgery Center Of NErie Christina Amm9845-6Tera Mater7569470 [facility] Orthopaedic CenMaLucretia R305-LosKentucky KentuckLoreHomero1Charm Barges651DuanePeggyannC7Kentuckyh(662Kentucky1MTriangle Gastroenterology PLZOKentuckyXWR'U16ShKentuckyarl S1706Collen302-3Kentucky34161123Kentucky(201)FairZOXWUnity Surgical Center LKentuckyLCRMarland McKentucky85ChristKentuckyiane Kentucky1(563) 8ChW2UJWJ'ChryFaSprKorTr85wVibra Specialty Hosp161Kentucky00987122Roda ShutterswNew York Presbyterian Hospital - Westchester DiviCliffton AsCherre HugWarnell60 ForesterellaghtningryOutpatient Womens And Childrens Surgery [id number] Heigh G>ed with Dr. Paraschos and he is in agreement.  Signed: Javiana Anwar Michelle Gwendolyn Mclees , PA-C 09/11/2022, 8:01 AM [facility]

## 2022-09-11 NOTE — Discharge Instructions (Signed)

## 2022-09-11 NOTE — Consult Note (Signed)
   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  Mild LVH.  Grade 1 diastolic dysfunction.  Mild to moderate left atrial enlargement.  Mild right atrial enlargement.  She presented to the emergency room with complaints of worsening shortness of breath PND, orthopnea and lower extremity edema.  BNP 523.  Chest x-ray revealed pulmonary congestion with small bilateral pleural effusions.  Comorbidities:  Anemia Anxiety/depression Arthritis COPD Hypertension  hyperlipidemia   Medications:  5 mg 2 times a day Diltiazem 360 mg daily Furosemide 40 mg IV daily Levothyroxine 88 mcg daily Metoprolol tart Tate 25 mg 2 times a day Symbotex statin 20 mg daily  Labs:  DM 136, potassium 4, chloride 98, CO2 29, BUN 15, creatinine 1.03, estimated GFR 56 TSH 2.578 Weight is 64.4 kg Intake 236 mL Output 100 mL  She states that she has heard the term heart failure before but not in relationship to herself.  Discussed heart failure and what it means.  Initial meeting with patient.  She states that she lives at home by herself and fixes her own meals.  She does admit to using salt and cooking with salt when fixing her meals.  Discussed the importance of maintaining a low-sodium diet.  She states that the dietitian was also in spoke with her.  Made aware of fluid restriction of no more than 64 ounces in a days time and what constitutes a liquid.  Also discussed the importance of daily weights and reporting 2 pound weight gain overnight or 5 pounds within the week.  She was given the living with heart failure teaching booklet, zone magnet, info on heart failure and low-sodium along with weight chart.  He has follow-up in the outpatient heart failure clinic on January 4 at 12:00.  She has a 0% no-show which is 0 out of 20 appointments.  She has no further questions.  Tresa Endo RN CHFN

## 2022-09-11 NOTE — Progress Notes (Signed)
Initial Nutrition Assessment  DOCUMENTATION CODES:   Non-severe (moderate) malnutrition in context of chronic illness  INTERVENTION:   -MVI with minerals daily -Ensure Enlive po BID, each supplement provides 350 kcal and 20 grams of protein.  -Liberalize diet to 2 gram sodium for wider variety of meal selections -Educated pt on CHF management; provided "Low Sodium Nutrition Therapy" handout from [employer]'s Nutrition Therapy handout  NUTRITION DIAGNOSIS:   Moderate Malnutrition related to chronic illness (CHF) as evidenced by mild fat depletion, moderate fat depletion, mild muscle depletion, moderate muscle depletion.  GOAL:   Patient will meet greater than or equal to 90% of their needs  MONITOR:   PO intake, Supplement acceptance  REASON FOR ASSESSMENT:   Malnutrition Screening Tool    ASSESSMENT:   Pt with medical history significant for A-fib, COPD not on home O2, history of tobacco use, hypothyroidism, hypertension, hyperlipidemia, osteoporosis, who presents with several weeks of progressively worsening shortness of breath.  Pt admitted with CHF.   Reviewed I/O's: +137 ml x 24 hours  UOP: 100 ml x 24 hours   Spoke with pt at bedside, who was pleasant and in good spirits today. She reports that she was experienced a general decline in health over the past 4 weeks due to feeling unwell. Per pt, she has a decreased appetite and has been eating about 50% of what she used to. Per pt, she ate a pancake and piece of sausage for breakfast. Typical meal intake is Breakfast: bowl of cereal (shredded what or cheerios); Lunch: peanut butter and jelly; Dinner: fish stick or pork chops with a starch and vegetable. Pt uses an indoor grill to cook food, but reports she receives mixed messages about what she should be eating.   Per pt, her UBW is around 175# and estimates she has lost about 8 pounds over the past month. Pt weighs herself daily. Discussed importance of daily weights and  parameters in which to contact CHF clinic. Reviewed wt hx; pt has experienced a 7.2% wt loss over the past 6 months, which is not significant for time frame.   RD provided "Low Sodium Nutrition Therapy" handout from the Academy of Nutrition and Dietetics. Reviewed patient's dietary recall. Provided examples on ways to decrease sodium intake in diet. Discouraged intake of processed foods and use of salt shaker. Encouraged fresh fruits and vegetables as well as whole grain sources of carbohydrates to maximize fiber intake.   RD discussed why it is important for patient to adhere to diet recommendations, and emphasized the role of fluids, foods to avoid, and importance of weighing self daily. Teach back method used.  Expect good compliance.  Discussed importance of good meal and supplement intake to promote healing. Pt amenable to try Ensure, as she has drank them in the past.   Medications reviewed and include cardizem and lasix.   Labs reviewed.   NUTRITION - FOCUSED PHYSICAL EXAM:  Flowsheet Row Most Recent Value  Orbital Region Mild depletion  Upper Arm Region Mild depletion  Thoracic and Lumbar Region No depletion  Buccal Region No depletion  Temple Region Mild depletion  Clavicle Bone Region Moderate depletion  Clavicle and Acromion Bone Region Moderate depletion  Scapular Bone Region Moderate depletion  Dorsal Hand Mild depletion  Patellar Region Mild depletion  Anterior Thigh Region Mild depletion  Posterior Calf Region Mild depletion  Edema (RD Assessment) None  Hair Reviewed  Eyes Reviewed  Mouth Reviewed  Skin Reviewed  Nails Reviewed  Diet Order:   Diet Order             Diet 2 gram sodium Fluid consistency: Thin  Diet effective now                   EDUCATION NEEDS:   Education needs have been addressed  Skin:  Skin Assessment: Reviewed RN Assessment  Last BM:  09/10/22  Height:   Ht Readings from Last 1 Encounters:  09/09/22 5\' 4"  (1.626  m)    Weight:   Wt Readings from Last 1 Encounters:  09/11/22 64.4 kg    Ideal Body Weight:  54.5 kg  BMI:  Body mass index is 24.37 kg/m.  Estimated Nutritional Needs:   Kcal:  [id number]  Protein:  85-100 grams  Fluid:  > 1.7 L    Loistine Chance, RD, LDN, [place] Registered Dietitian II Certified Diabetes Care and Education Specialist Please refer to [facility] for RD and/or RD on-call/weekend/after hours pager

## 2022-09-11 NOTE — Plan of Care (Signed)

## 2022-09-12 DIAGNOSIS — E440 Moderate protein-calorie malnutrition: Secondary | ICD-10-CM | POA: Insufficient documentation

## 2022-09-12 DIAGNOSIS — J9601 Acute respiratory failure with hypoxia: Secondary | ICD-10-CM | POA: Diagnosis not present

## 2022-09-12 DIAGNOSIS — I480 Paroxysmal atrial fibrillation: Secondary | ICD-10-CM | POA: Diagnosis not present

## 2022-09-12 DIAGNOSIS — I5033 Acute on chronic diastolic (congestive) heart failure: Secondary | ICD-10-CM | POA: Diagnosis not present

## 2022-09-12 LAB — BASIC METABOLIC PANEL WITH GFR
Anion gap: 6 (ref 5–15)
BUN: 18 mg/dL (ref 8–23)
CO2: 34 mmol/L — ABNORMAL HIGH (ref 22–32)
Calcium: 8.8 mg/dL — ABNORMAL LOW (ref 8.9–10.3)
Chloride: 96 mmol/L — ABNORMAL LOW (ref 98–111)
Creatinine, Ser: 0.97 mg/dL (ref 0.44–1.00)
GFR, Estimated: >60 mL/min
Glucose, Bld: 102 mg/dL — ABNORMAL HIGH (ref 70–99)
Potassium: 3.6 mmol/L (ref 3.5–5.1)
Sodium: 136 mmol/L (ref 135–145)

## 2022-09-12 LAB — T4: T4, Total: 9.7 ug/dL (ref 4.5–12.0)

## 2022-09-12 LAB — MAGNESIUM: Magnesium: 2.1 mg/dL (ref 1.7–2.4)

## 2022-09-12 MED ORDER — SENNOSIDES-DOCUSATE SODIUM 8.6-50 MG PO TABS: 2.0000 | ORAL_TABLET | Freq: Two times a day (BID) | ORAL | 0 refills | Status: AC | PRN

## 2022-09-12 MED ORDER — DILTIAZEM HCL ER COATED BEADS 360 MG PO CP24: 360.0000 mg | ORAL_CAPSULE | Freq: Every day | ORAL | 0 refills | Status: DC

## 2022-09-12 NOTE — Progress Notes (Signed)
   Heart Failure Nurse Navigator Note    Met with patient today in follow up to yesterdays session.  She had no further questions about heart failure, unable to read the teaching materials due to not having her glasses for reading.  She does have questions about what she can and can not eat with being on Eliquis,  she read that she could not eat things like oranges or things seasoned with ginger.  Secure chatted with pharmacy to talk to patient.  She was also given diet handout on low sodium.  She had no further questions.    RN CHFN 

## 2022-09-12 NOTE — Discharge Summary (Signed)
Physician Discharge Summary   Patient: Christina Carr MRN: [id number] DOB: 02-19-1946  Admit date:     09/09/2022  Discharge date: 09/12/22  Discharge Physician: Marrion Coy   PCP: Barbette Reichmann, MD   Recommendations at discharge:   PCP in 1 week. Follow-up with cardiology in 1 week.  Discharge Diagnoses: Principal Problem:   Acute on chronic diastolic CHF (congestive heart failure) (HCC) Active Problems:   A-fib (HCC)   Acquired hypothyroidism   Essential hypertension   COPD (chronic obstructive pulmonary disease) (HCC)   Known medical problems   Paroxysmal atrial fibrillation with RVR (HCC)   Acute hypoxemic respiratory failure (HCC)   Malnutrition of moderate degree  Resolved Problems:   * No resolved hospital problems. *  Hospital Course:  Christina Carr is a 76 y.o. female with medical history significant for A-fib, COPD not on home O2, history of tobacco use, hypothyroidism, hypertension, hyperlipidemia, osteoporosis, who presents with several weeks of progressively worsening shortness of breath.  Chest x-ray showed bilateral pleural effusion and pulmonary congestion.  EKG showed A-fib with RVR.  Patient was given IV Lasix, placed on diltiazem drip. Patient was changed to oral diltiazem and metoprolol, also given a dose of injection.  Also received IV Lasix.  Condition has improved, heart rate much better controlled, medically stable to be discharged. Assessment and Plan: Acute on chronic diastolic congestive heart failure. Paroxysmal atrial fibrillation with RVR. Acute hypoxemic respiratory or secondary to congestive heart failure.  POA. Patient is volume overloaded, with elevated BNP, chest x-ray showed vascular congestion with pleural effusion.  This probably triggered by tachycardia from atrial fibrillation. Patient oxygenation has dropped down to as low as 79%, with associated shortness of breath.   Patient received IV Lasix, also received diltiazem and  metoprolol.  Condition has improved, shortness of breath much better, off oxygen, medically stable to be discharged.  Heart rate Uncontrolled.  Will continue anticoagulation and oral diltiazem and metoprolol.  Follow-up with PCP and cardiology as outpatient.   COPD. Stable.   Essential hypertension Continue some home medicines.       Consultants: Cardiology Procedures performed: None  Disposition: Home Diet recommendation:  Discharge Diet Orders (From admission, onward)     Start     Ordered   09/12/22 0000  Diet - low sodium heart healthy        09/12/22 1006           Cardiac diet DISCHARGE MEDICATION: Allergies as of 09/12/2022       Reactions   Alendronate    Chest pain   Hydrochlorothiazide    Sulfa Antibiotics Nausea And Vomiting        Medication List     STOP taking these medications    amLODipine 10 MG tablet Commonly known as: NORVASC   cholecalciferol 1000 units tablet Commonly known as: VITAMIN D   CHOLINE CITRATE PO   Ginkgo Biloba 60 MG Caps   Inositol 324 MG Tabs   lisinopril 10 MG tablet Commonly known as: ZESTRIL   Prolia 60 MG/ML Sosy injection Generic drug: denosumab   Refresh 1.4-0.6 % Soln Generic drug: Polyvinyl Alcohol-Povidone PF   SUPER B COMPLEX PO       TAKE these medications    apixaban 5 MG Tabs tablet Commonly known as: ELIQUIS Take 1 tablet (5 mg total) by mouth 2 (two) times daily.   CALCIUM 600 + D PO Take 1 tablet by mouth daily after supper.   Co Q 10 10  MG Caps Take 10 mg by mouth daily.   diltiazem 360 MG 24 hr capsule Commonly known as: CARDIZEM CD Take 1 capsule (360 mg total) by mouth daily.   donepezil 5 MG tablet Commonly known as: ARICEPT Take 5 mg by mouth at bedtime.   DULoxetine 20 MG capsule Commonly known as: CYMBALTA Take 20 mg by mouth daily.   Glucosamine Sulfate 1000 MG Caps Take 1,000 mg by mouth 2 (two) times daily. After breakfast and after supper.   levothyroxine  88 MCG tablet Commonly known as: SYNTHROID Take 88 mcg by mouth daily before breakfast.   melatonin 5 MG Tabs Take 10 mg by mouth at bedtime as needed.   metoprolol tartrate 25 MG tablet Commonly known as: LOPRESSOR Take 1 tablet (25 mg total) by mouth 2 (two) times daily.   simvastatin 20 MG tablet Commonly known as: ZOCOR Take 20 mg by mouth daily at 8 pm. 2100        Follow-up Information     Callwood, Dwayne D, MD. Go in 1 week(s).   Specialties: Cardiology, Internal Medicine Contact information: [address] [place] [address] [phone]         Barbette Reichmann, MD Follow up in 1 week(s).   Specialty: Internal Medicine Contact information: [address] [place] [address] [phone]                Discharge Exam: Ceasar Mons Weights   09/09/22 2224 09/11/22 0435 09/12/22 0500  Weight: 68 kg 64.4 kg 64.4 kg   General exam: Appears calm and comfortable  Respiratory system: Clear to auscultation. Respiratory effort normal. Cardiovascular system: Irregular. No JVD, murmurs, rubs, gallops or clicks. No pedal edema. Gastrointestinal system: Abdomen is nondistended, soft and nontender. No organomegaly or masses felt. Normal bowel sounds heard. Central nervous system: Alert and oriented. No focal neurological deficits. Extremities: Symmetric 5 x 5 power. Skin: No rashes, lesions or ulcers Psychiatry: Judgement and insight appear normal. Mood & affect appropriate.    Condition at discharge: good  The results of significant diagnostics from this hospitalization (including imaging, microbiology, ancillary and laboratory) are listed below for reference.   Imaging Studies: ECHOCARDIOGRAM COMPLETE  Result Date: 09/10/2022    ECHOCARDIOGRAM REPORT   Patient Name:   Christina Carr Date of Exam: 09/10/2022 Medical Rec #:  [id number]       Height:       64.0 in Accession #:    [id number]      Weight:       150.0 lb  Date of Birth:  18-Apr-1946        BSA:          1.731 m Patient Age:    76 years        BP:           109/75 mmHg Patient Gender: F               HR:           78 bpm. Exam Location:  [facility] Procedure: 2D Echo Indications:     CHF I50.31  History:         Patient has prior history of Echocardiogram examinations, most                  recent 06/23/2022.  Sonographer:     Overton Mam RDCS Referring Phys:  [id number] MATTHEW M ECKSTAT Diagnosing Phys: Alwyn Pea MD IMPRESSIONS  1. Left  ventricular ejection fraction, by estimation, is 50 to 55%. The left ventricle has low normal function. The left ventricle demonstrates global hypokinesis. The left ventricular internal cavity size was mildly dilated. There is mild left ventricular hypertrophy. Left ventricular diastolic parameters are consistent with Grade I diastolic dysfunction (impaired relaxation).  2. Right ventricular systolic function is normal. The right ventricular size is normal.  3. Left atrial size was mild to moderately dilated.  4. Right atrial size was mildly dilated.  5. The mitral valve is normal in structure. Trivial mitral valve regurgitation.  6. The aortic valve is grossly normal. Aortic valve regurgitation is not visualized. Aortic valve sclerosis is present, with no evidence of aortic valve stenosis. FINDINGS  Left Ventricle: Left ventricular ejection fraction, by estimation, is 50 to 55%. The left ventricle has low normal function. The left ventricle demonstrates global hypokinesis. The left ventricular internal cavity size was mildly dilated. There is mild left ventricular hypertrophy. Left ventricular diastolic parameters are consistent with Grade I diastolic dysfunction (impaired relaxation). Right Ventricle: The right ventricular size is normal. No increase in right ventricular wall thickness. Right ventricular systolic function is normal. Left Atrium: Left atrial size was mild to moderately dilated. Right Atrium: Right atrial size was  mildly dilated. Pericardium: There is no evidence of pericardial effusion. Mitral Valve: The mitral valve is normal in structure. Trivial mitral valve regurgitation. Tricuspid Valve: The tricuspid valve is normal in structure. Tricuspid valve regurgitation is trivial. Aortic Valve: The aortic valve is grossly normal. Aortic valve regurgitation is not visualized. Aortic valve sclerosis is present, with no evidence of aortic valve stenosis. Aortic valve peak gradient measures 4.4 mmHg. Pulmonic Valve: The pulmonic valve was normal in structure. Pulmonic valve regurgitation is trivial. Aorta: The ascending aorta was not well visualized. IAS/Shunts: No atrial level shunt detected by color flow Doppler.  LEFT VENTRICLE PLAX 2D LVIDd:         4.60 cm     Diastology LVIDs:         3.40 cm     LV e' medial:    9.57 cm/s LV PW:         1.20 cm     LV E/e' medial:  12.9 LV IVS:        1.30 cm     LV e' lateral:   8.92 cm/s LVOT diam:     1.90 cm     LV E/e' lateral: 13.8 LV SV:         36 LV SV Index:   21 LVOT Area:     2.84 cm  LV Volumes (MOD) LV vol d, MOD A2C: 77.3 ml LV vol d, MOD A4C: 70.3 ml LV vol s, MOD A2C: 31.7 ml LV vol s, MOD A4C: 46.5 ml LV SV MOD A2C:     45.6 ml LV SV MOD A4C:     70.3 ml LV SV MOD BP:      36.5 ml RIGHT VENTRICLE RV Basal diam:  2.70 cm RV S prime:     10.90 cm/s TAPSE (M-mode): 1.5 cm LEFT ATRIUM              Index        RIGHT ATRIUM           Index LA diam:        4.70 cm  2.71 cm/m   RA Area:     13.40 cm LA Vol (A2C):   117.0 ml 67.58 ml/m  RA Volume:  30.90 ml  17.85 ml/m LA Vol (A4C):   107.0 ml 61.81 ml/m LA Biplane Vol: 121.0 ml 69.89 ml/m  AORTIC VALVE                 PULMONIC VALVE AV Area (Vmax): 1.76 cm     PV Vmax:       0.90 m/s AV Vmax:        105.00 cm/s  PV Peak grad:  3.2 mmHg AV Peak Grad:   4.4 mmHg LVOT Vmax:      65.10 cm/s LVOT Vmean:     38.900 cm/s LVOT VTI:       0.128 m  AORTA Ao Root diam: 3.20 cm Ao Asc diam:  3.20 cm MITRAL VALVE                 TRICUSPID VALVE MV Area (PHT): 4.36 cm     TR Peak grad:   22.1 mmHg MV Decel Time: 174 msec     TR Vmax:        235.00 cm/s MV E velocity: 123.00 cm/s                             SHUNTS                             Systemic VTI:  0.13 m                             Systemic Diam: 1.90 cm Alwyn Pea MD Electronically signed by Alwyn Pea MD Signature Date/Time: 09/10/2022/3:07:39 PM    Final    DG Chest Portable 1 View  Result Date: 09/09/2022 CLINICAL DATA:  Chest pain and shortness of breath. EXAM: PORTABLE CHEST 1 VIEW COMPARISON:  Chest radiograph dated June 22, 2022 FINDINGS: The heart appears enlarged on this AP projection. Atherosclerotic calcification of the aortic arch. Pulmonary vascular congestion and bilateral lower lobe opacities suggesting small bilateral pleural effusion. Associated atelectasis and/or underlying airspace disease can not be excluded. IMPRESSION: Pulmonary vascular congestion and bilateral lower lobe opacities suggesting small bilateral pleural effusion. Associated atelectasis and/or underlying airspace disease can not be excluded. Electronically Signed   By: Larose Hires D.O.   On: 09/09/2022 22:49    Microbiology: Results for orders placed or performed during the hospital encounter of 09/09/22  Resp panel by RT-PCR (RSV, Flu A&B, Covid) Anterior Nasal Swab     Status: None   Collection Time: 09/10/22  1:27 AM   Specimen: Anterior Nasal Swab  Result Value Ref Range Status   SARS Coronavirus 2 by RT PCR NEGATIVE NEGATIVE Final    Comment: (NOTE) SARS-CoV-2 target nucleic acids are NOT DETECTED.  The SARS-CoV-2 RNA is generally detectable in upper respiratory specimens during the acute phase of infection. The lowest concentration of SARS-CoV-2 viral copies this assay can detect is 138 copies/mL. A negative result does not preclude SARS-Cov-2 infection and should not be used as the sole basis for treatment or other patient management decisions. A  negative result may occur with  improper specimen collection/handling, submission of specimen other than nasopharyngeal swab, presence of viral mutation(s) within the areas targeted by this assay, and inadequate number of viral copies(<138 copies/mL). A negative result must be combined with clinical observations, patient history, and epidemiological information. The expected result is Negative.  Fact Sheet  for Patients:  BloggerCourse.comhttps://www.fda.gov/media/152166/download  Fact Sheet for Healthcare Providers:  SeriousBroker.ithttps://www.fda.gov/media/152162/download  This test is no t yet approved or cleared by the [place] FDA and  has been authorized for detection and/or diagnosis of SARS-CoV-2 by FDA under an Emergency Use Authorization (EUA). This EUA will remain  in effect (meaning this test can be used) for the duration of the COVID-19 declaration under Section 564(b)(1) of the Act, 21 U.S.C.section 360bbb-3(b)(1), unless the authorization is terminated  or revoked sooner.       Influenza A by PCR NEGATIVE NEGATIVE Final   Influenza B by PCR NEGATIVE NEGATIVE Final    Comment: (NOTE) The Xpert Xpress SARS-CoV-2/FLU/RSV plus assay is intended as an aid in the diagnosis of influenza from Nasopharyngeal swab specimens and should not be used as a sole basis for treatment. Nasal washings and aspirates are unacceptable for Xpert Xpress SARS-CoV-2/FLU/RSV testing.  Fact Sheet for Patients: BloggerCourse.comhttps://www.fda.gov/media/152166/download  Fact Sheet for Healthcare Providers: SeriousBroker.ithttps://www.fda.gov/media/152162/download  This test is not yet approved or cleared by the [place] FDA and has been authorized for detection and/or diagnosis of SARS-CoV-2 by FDA under an Emergency Use Authorization (EUA). This EUA will remain in effect (meaning this test can be used) for the duration of the COVID-19 declaration under Section 564(b)(1) of the Act, 21 U.S.C. section 360bbb-3(b)(1), unless the authorization  is terminated or revoked.     Resp Syncytial Virus by PCR NEGATIVE NEGATIVE Final    Comment: (NOTE) Fact Sheet for Patients: BloggerCourse.comhttps://www.fda.gov/media/152166/download  Fact Sheet for Healthcare Providers: SeriousBroker.ithttps://www.fda.gov/media/152162/download  This test is not yet approved or cleared by the [place] FDA and has been authorized for detection and/or diagnosis of SARS-CoV-2 by FDA under an Emergency Use Authorization (EUA). This EUA will remain in effect (meaning this test can be used) for the duration of the COVID-19 declaration under Section 564(b)(1) of the Act, 21 U.S.C. section 360bbb-3(b)(1), unless the authorization is terminated or revoked.  Performed at [facility], [address] [place], [place] [id number]     Labs: CBC: Recent Labs  Lab 09/09/22 2219 09/10/22 0451  WBC 10.5 9.5  NEUTROABS 6.0  --   HGB 13.8 13.8  HCT 43.2 42.9  MCV 91.3 90.7  PLT 275 257   Basic Metabolic Panel: Recent Labs  Lab 09/09/22 2219 09/10/22 0451 09/11/22 0942 09/12/22 0503  NA 136 138 136 136  K 4.5 4.8 4.0 3.6  CL 105 104 98 96*  CO2 23 25 29  34*  GLUCOSE 166* 132* 168* 102*  BUN 19 16 15 18   CREATININE 0.79 0.86 1.03* 0.97  CALCIUM 8.7* 9.2 9.2 8.8*  MG 2.0  --   --  2.1   Liver Function Tests: Recent Labs  Lab 09/09/22 2219  AST 44*  ALT 47*  ALKPHOS 55  BILITOT 0.8  PROT 6.7  ALBUMIN 3.8   CBG: No results for input(s): "GLUCAP" in the last 168 hours.  Discharge time spent: greater than 30 minutes.  Signed: Marrion Coyekui Jessyka Austria, MD [facility] 09/12/2022

## 2022-09-12 NOTE — Care Management Important Message (Signed)
Important Message  Patient Details  Name: Christina Carr MRN: [id number] Date of Birth: 04/03/1946   Medicare Important Message Given:  Yes     Johnell Comings 09/12/2022, 11:48 AM
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
## 2022-09-13 LAB — GLUCOSE, CAPILLARY: Glucose-Capillary: 151 mg/dL — ABNORMAL HIGH (ref 70–99)

## 2022-09-27 NOTE — Progress Notes (Unsigned)
   Patient ID: Christina Carr, female    DOB: [birth year], 77 y.o.   MRN: [id number]  HPI  Christina Carr is a 77 y/o female with a history of  Echo report from 09/10/22 showed an EF of 50-55% along with mild LVH/ LAE and trivial MR.   Admitted 09/09/22 due to acute on chronic HF with AF RVR. Given IV lasix and diltiazem drip. Discharged after 3 days. Was in the ED 06/22/22 due to AF.   She presents today for her initial visit with a chief complaint of   Review of Systems    Physical Exam    Assessment & Plan:  1: Chronic heart failure with preserved ejection fraction with structural changes (LVH/ LAE)- - NYHA class - on GDMT of - BNP 09/09/22 was 523.0  2: HTN- - BP - saw PCP (Christina Carr) 09/19/22 - BMP 09/12/22 showed sodium 136, potassium 3.6, creatinine 0.97 & GFR >60  3: AF- - to see cardiology (Christina Carr)  4: COPD-

## 2022-09-28 ENCOUNTER — Ambulatory Visit: Payer: Medicare PPO | Attending: Family | Admitting: Family

## 2022-09-28 ENCOUNTER — Encounter: Payer: Self-pay | Admitting: Family

## 2022-09-28 VITALS — BP 145/96 | HR 94 | Resp 18 | Wt 139.4 lb

## 2022-09-28 DIAGNOSIS — R0602 Shortness of breath: Secondary | ICD-10-CM | POA: Diagnosis not present

## 2022-09-28 DIAGNOSIS — I480 Paroxysmal atrial fibrillation: Secondary | ICD-10-CM

## 2022-09-28 DIAGNOSIS — Z87891 Personal history of nicotine dependence: Secondary | ICD-10-CM | POA: Diagnosis not present

## 2022-09-28 DIAGNOSIS — J439 Emphysema, unspecified: Secondary | ICD-10-CM | POA: Insufficient documentation

## 2022-09-28 DIAGNOSIS — R002 Palpitations: Secondary | ICD-10-CM | POA: Diagnosis not present

## 2022-09-28 DIAGNOSIS — I110 Hypertensive heart disease with heart failure: Secondary | ICD-10-CM | POA: Diagnosis not present

## 2022-09-28 DIAGNOSIS — I10 Essential (primary) hypertension: Secondary | ICD-10-CM | POA: Diagnosis not present

## 2022-09-28 DIAGNOSIS — F32A Depression, unspecified: Secondary | ICD-10-CM | POA: Diagnosis not present

## 2022-09-28 DIAGNOSIS — R0789 Other chest pain: Secondary | ICD-10-CM | POA: Diagnosis present

## 2022-09-28 DIAGNOSIS — E785 Hyperlipidemia, unspecified: Secondary | ICD-10-CM | POA: Insufficient documentation

## 2022-09-28 DIAGNOSIS — J449 Chronic obstructive pulmonary disease, unspecified: Secondary | ICD-10-CM | POA: Diagnosis not present

## 2022-09-28 DIAGNOSIS — F419 Anxiety disorder, unspecified: Secondary | ICD-10-CM | POA: Insufficient documentation

## 2022-09-28 DIAGNOSIS — I5032 Chronic diastolic (congestive) heart failure: Secondary | ICD-10-CM | POA: Insufficient documentation

## 2022-09-28 DIAGNOSIS — I4891 Unspecified atrial fibrillation: Secondary | ICD-10-CM | POA: Insufficient documentation

## 2022-09-28 MED ORDER — EMPAGLIFLOZIN 10 MG PO TABS: 10.0000 mg | ORAL_TABLET | Freq: Every day | ORAL | 5 refills | Status: DC

## 2022-09-28 NOTE — Patient Instructions (Addendum)
Continue weighing daily and call for an overnight weight gain of 3 pounds or more or a weekly weight gain of more than 5 pounds.   If you have voicemail, please make sure your mailbox is cleaned out so that we may leave a message and please make sure to listen to any voicemails.    Keep daily fluid intake to 60-64 ounces.    Start taking jardiance as 1 tablet every morning. I have sent the prescription to your pharmacy with the voucher attached to the prescription

## 2022-10-02 ENCOUNTER — Emergency Department: Payer: Medicare PPO

## 2022-10-02 ENCOUNTER — Other Ambulatory Visit: Payer: Self-pay

## 2022-10-02 ENCOUNTER — Emergency Department
Admission: EM | Admit: 2022-10-02 | Discharge: 2022-10-02 | Disposition: A | Discharge status: Home / Self Care | Payer: Medicare PPO | Attending: Emergency Medicine | Admitting: Emergency Medicine

## 2022-10-02 DIAGNOSIS — R0781 Pleurodynia: Secondary | ICD-10-CM

## 2022-10-02 DIAGNOSIS — I110 Hypertensive heart disease with heart failure: Secondary | ICD-10-CM | POA: Diagnosis not present

## 2022-10-02 DIAGNOSIS — S20212A Contusion of left front wall of thorax, initial encounter: Secondary | ICD-10-CM | POA: Diagnosis not present

## 2022-10-02 DIAGNOSIS — W01198A Fall on same level from slipping, tripping and stumbling with subsequent striking against other object, initial encounter: Secondary | ICD-10-CM | POA: Diagnosis not present

## 2022-10-02 DIAGNOSIS — Z79899 Other long term (current) drug therapy: Secondary | ICD-10-CM | POA: Insufficient documentation

## 2022-10-02 DIAGNOSIS — J449 Chronic obstructive pulmonary disease, unspecified: Secondary | ICD-10-CM | POA: Diagnosis not present

## 2022-10-02 DIAGNOSIS — S299XXA Unspecified injury of thorax, initial encounter: Secondary | ICD-10-CM | POA: Diagnosis present

## 2022-10-02 DIAGNOSIS — Z7901 Long term (current) use of anticoagulants: Secondary | ICD-10-CM | POA: Diagnosis not present

## 2022-10-02 DIAGNOSIS — S0990XA Unspecified injury of head, initial encounter: Secondary | ICD-10-CM | POA: Diagnosis not present

## 2022-10-02 DIAGNOSIS — I509 Heart failure, unspecified: Secondary | ICD-10-CM | POA: Diagnosis not present

## 2022-10-02 DIAGNOSIS — W19XXXA Unspecified fall, initial encounter: Secondary | ICD-10-CM

## 2022-10-02 DIAGNOSIS — Y92512 Supermarket, store or market as the place of occurrence of the external cause: Secondary | ICD-10-CM | POA: Diagnosis not present

## 2022-10-02 DIAGNOSIS — E039 Hypothyroidism, unspecified: Secondary | ICD-10-CM | POA: Insufficient documentation

## 2022-10-02 NOTE — Discharge Instructions (Signed)
Please take Tylenol every 6 hours as needed for pain.  You may apply ice to the left ribs 20 minutes every hour for the next 2 days then transition over to heat.  Return to the ER for any severe headaches, vomiting, chest pain, shortness of breath or any urgent changes in her health.

## 2022-10-02 NOTE — ED Provider Notes (Signed)
[facility] DEPARTMENT Provider Note   CSN: [id number] Arrival date & time: 10/02/22  1133     History  Chief Complaint  Patient presents with   Christina Carr is a 77 y.o. female with past medical history of COPD, depression, hypertension, hypercholesterolemia, CHF, hypothyroidism and osteoporosis with atrial fibrillation on Eliquis presents to the emergency department for mechanical fall that occurred around 11 AM this morning.  Patient states her grocery cart was beginning to roll away from her, she tripped over her feet and fell landing on her left chest wall, she states she hit her cheek and head, no headache, LOC, nausea or vomiting.  No neck pain numbness tingling radicular symptoms.  No lower back, abdominal or lower extremity discomfort.  She remains ambulatory.  No dizziness or lightheadedness.  She has sharp pain to the left anterior distal ribs with movement but no pain with rest.  She denies any current headache, nausea vomiting or photophobia.  HPI     Home Medications Prior to Admission medications   Medication Sig Start Date End Date Taking? Authorizing Provider  apixaban (ELIQUIS) 5 MG TABS tablet Take 1 tablet (5 mg total) by mouth 2 (two) times daily. 06/23/22   Enzo Bi, MD  Calcium Carb-Cholecalciferol (CALCIUM 600 + D PO) Take 1 tablet by mouth daily after supper.    [provider]  Coenzyme Q10 (CO Q 10) 10 MG CAPS Take 10 mg by mouth daily.    [provider]  diltiazem (CARDIZEM CD) 360 MG 24 hr capsule Take 1 capsule (360 mg total) by mouth daily. 09/12/22   Sharen Hones, MD  donepezil (ARICEPT) 5 MG tablet Take 5 mg by mouth at bedtime. 04/10/22   [provider]  DULoxetine (CYMBALTA) 20 MG capsule Take 20 mg by mouth daily. 05/09/22   [provider]  empagliflozin (JARDIANCE) 10 MG TABS tablet Take 1 tablet (10 mg total) by mouth daily before breakfast. 09/28/22   Darylene Price A, FNP   Glucosamine Sulfate 1000 MG CAPS Take 1,000 mg by mouth 2 (two) times daily. After breakfast and after supper.    [provider]  levothyroxine (SYNTHROID) 88 MCG tablet Take 88 mcg by mouth daily before breakfast. 07/04/22 07/04/23  [provider]  melatonin 5 MG TABS Take 10 mg by mouth at bedtime as needed.    [provider]  metoprolol tartrate (LOPRESSOR) 25 MG tablet Take 1 tablet (25 mg total) by mouth 2 (two) times daily. 06/23/22   Enzo Bi, MD  senna-docusate (SENOKOT-S) 8.6-50 MG tablet Take 2 tablets by mouth 2 (two) times daily as needed for mild constipation. 09/12/22   Sharen Hones, MD  simvastatin (ZOCOR) 20 MG tablet Take 20 mg by mouth daily at 8 pm. 2100    [provider]      Allergies    Alendronate, Hydrochlorothiazide, and Sulfa antibiotics    Review of Systems   Review of Systems  Physical Exam Updated Vital Signs BP 121/73   Pulse 95   Temp 97.6 F (36.4 C)   Resp 16   SpO2 100%  Physical Exam Constitutional:      General: She is not in acute distress.    Appearance: Normal appearance. She is well-developed and normal weight. She is not ill-appearing, toxic-appearing or diaphoretic.  HENT:     Head: Normocephalic and atraumatic.  Eyes:     Conjunctiva/sclera: Conjunctivae normal.  Cardiovascular:  Rate and Rhythm: Normal rate.  Pulmonary:     Effort: Pulmonary effort is normal. No respiratory distress.     Breath sounds: Normal breath sounds. No wheezing or rales.     Comments: Left anterior chest wall tenderness along the anterior 12th rib.  No bruising, swelling.  No step-off. Chest:     Chest wall: Tenderness present.  Abdominal:     General: There is no distension.     Tenderness: There is no abdominal tenderness. There is no guarding.  Musculoskeletal:        General: Normal range of motion.     Cervical back: Normal range of motion.     Comments: No cervical thoracic or lumbar spinous process  tenderness.  No pain with bilateral hip passive range of motion.  No pain with palpation of both shoulders, clavicle sternum.  Skin:    General: Skin is warm.     Findings: No rash.  Neurological:     General: No focal deficit present.     Mental Status: She is alert and oriented to person, place, and time.     Cranial Nerves: No cranial nerve deficit.     Motor: No weakness.     Gait: Gait normal.  Psychiatric:        Behavior: Behavior normal.        Thought Content: Thought content normal.     ED Results / Procedures / Treatments   Labs (all labs ordered are listed, but only abnormal results are displayed) Labs Reviewed - No data to display  EKG None  Radiology DG Ribs Unilateral W/Chest Left  Result Date: 10/02/2022 CLINICAL DATA:  Fall.  Left anterior chest wall pain. EXAM: LEFT RIBS AND CHEST - 3+ VIEW COMPARISON:  09/09/2022.  CT, 06/22/2022. FINDINGS: No rib fracture or rib lesion. Skeletal structures are demineralized. Cardiac silhouette mildly enlarged. No mediastinal or hilar masses. Clear lungs. No pleural effusion or pneumothorax. IMPRESSION: 1. No rib fracture or rib lesion. 2. No acute cardiopulmonary disease. Electronically Signed   By: Amie Portland M.D.   On: 10/02/2022 16:11   CT Head Wo Contrast  Result Date: 10/02/2022 CLINICAL DATA:  Fall. EXAM: CT HEAD WITHOUT CONTRAST CT CERVICAL SPINE WITHOUT CONTRAST TECHNIQUE: Multidetector CT imaging of the head and cervical spine was performed following the standard protocol without intravenous contrast. Multiplanar CT image reconstructions of the cervical spine were also generated. RADIATION DOSE REDUCTION: This exam was performed according to the departmental dose-optimization program which includes automated exposure control, adjustment of the mA and/or kV according to patient size and/or use of iterative reconstruction technique. COMPARISON:  MRI cervical spine dated May 15, 2021. FINDINGS: CT HEAD FINDINGS Brain: No  evidence of acute infarction, hemorrhage, hydrocephalus, extra-axial collection or mass lesion/mass effect. Mild generalized cerebral atrophy. Scattered moderate periventricular and subcortical white matter hypodensities are nonspecific, but favored to reflect chronic microvascular ischemic changes. Vascular: Atherosclerotic vascular calcification of the carotid siphons. No hyperdense vessel. Skull: Normal. Negative for fracture or focal lesion. Sinuses/Orbits: No acute finding. Other: None. CT CERVICAL SPINE FINDINGS Alignment: Normal. Skull base and vertebrae: No acute fracture. No primary bone lesion or focal pathologic process. Soft tissues and spinal canal: No prevertebral fluid or swelling. No visible canal hematoma. Disc levels: Mild disc height loss and facet uncovertebral hypertrophy from C4-C5 through C6-C7. Upper chest: Centrilobular emphysema. Other: None. IMPRESSION: 1. No acute intracranial abnormality. Moderate chronic microvascular ischemic changes. 2. No acute cervical spine fracture or traumatic listhesis. Mild cervical  spondylosis. 3.  Emphysema (ICD10-J43.9). Electronically Signed   By: Obie Dredge M.D.   On: 10/02/2022 12:05   CT Cervical Spine Wo Contrast  Result Date: 10/02/2022 CLINICAL DATA:  Fall. EXAM: CT HEAD WITHOUT CONTRAST CT CERVICAL SPINE WITHOUT CONTRAST TECHNIQUE: Multidetector CT imaging of the head and cervical spine was performed following the standard protocol without intravenous contrast. Multiplanar CT image reconstructions of the cervical spine were also generated. RADIATION DOSE REDUCTION: This exam was performed according to the departmental dose-optimization program which includes automated exposure control, adjustment of the mA and/or kV according to patient size and/or use of iterative reconstruction technique. COMPARISON:  MRI cervical spine dated May 15, 2021. FINDINGS: CT HEAD FINDINGS Brain: No evidence of acute infarction, hemorrhage, hydrocephalus,  extra-axial collection or mass lesion/mass effect. Mild generalized cerebral atrophy. Scattered moderate periventricular and subcortical white matter hypodensities are nonspecific, but favored to reflect chronic microvascular ischemic changes. Vascular: Atherosclerotic vascular calcification of the carotid siphons. No hyperdense vessel. Skull: Normal. Negative for fracture or focal lesion. Sinuses/Orbits: No acute finding. Other: None. CT CERVICAL SPINE FINDINGS Alignment: Normal. Skull base and vertebrae: No acute fracture. No primary bone lesion or focal pathologic process. Soft tissues and spinal canal: No prevertebral fluid or swelling. No visible canal hematoma. Disc levels: Mild disc height loss and facet uncovertebral hypertrophy from C4-C5 through C6-C7. Upper chest: Centrilobular emphysema. Other: None. IMPRESSION: 1. No acute intracranial abnormality. Moderate chronic microvascular ischemic changes. 2. No acute cervical spine fracture or traumatic listhesis. Mild cervical spondylosis. 3.  Emphysema (ICD10-J43.9). Electronically Signed   By: Obie Dredge M.D.   On: 10/02/2022 12:05    Procedures Procedures    Medications Ordered in ED Medications - No data to display  ED Course/ Medical Decision Making/ A&P                           Medical Decision Making Amount and/or Complexity of Data Reviewed Radiology: ordered.   77 year old female with mechanical fall earlier today.  She tripped landing on her left side injuring her left ribs and hitting her head.  No headache, LOC, nausea or vomiting.  She is on Eliquis.  CT of the head and cervical spine negative.  She does complain of some tenderness along the left distal 12th rib, there is no step-off.  X-rays ordered and independently reviewed by me today show no evidence of acute bony abnormality.  No signs of any acute cardiopulmonary process.  Patient's vital signs appear well.  She is currently without pain.  Pain only present with  movement along the left ribs.  Patient stable and ready for discharge to home.  She understands signs and symptoms return to the ER for. Final Clinical Impression(s) / ED Diagnoses Final diagnoses:  Fall, initial encounter  Injury of head, initial encounter  Rib pain on left side  Chest wall contusion, left, initial encounter    Rx / DC Orders ED Discharge Orders     None         Evon Slack, PA-C 10/02/22 1637    Merwyn Katos, MD 10/02/22 2318

## 2022-10-02 NOTE — ED Triage Notes (Signed)
Pt comes via EMS with c/o fall. Pt was grocery store and cart go away from her and she turned around loss balance and fell. Pt did hit left side of cheek on concrete. Pt denies any loc. Pt has lip abrasion, some chest way pain from the fall. Pt is on thinners

## 2022-10-13 ENCOUNTER — Other Ambulatory Visit: Payer: Self-pay | Admitting: Family

## 2022-10-13 MED ORDER — EMPAGLIFLOZIN 10 MG PO TABS: 10.0000 mg | ORAL_TABLET | Freq: Every day | ORAL | 3 refills | Status: DC

## 2022-10-19 ENCOUNTER — Encounter: Payer: Self-pay | Admitting: Family

## 2022-10-19 ENCOUNTER — Encounter: Payer: Self-pay | Admitting: Pharmacy Technician

## 2022-10-19 ENCOUNTER — Other Ambulatory Visit
Admission: RE | Admit: 2022-10-19 | Discharge: 2022-10-19 | Disposition: A | Discharge status: Home / Self Care | Payer: Medicare PPO | Source: Ambulatory Visit | Attending: Family | Admitting: Family

## 2022-10-19 ENCOUNTER — Other Ambulatory Visit (HOSPITAL_COMMUNITY): Payer: Self-pay

## 2022-10-19 ENCOUNTER — Ambulatory Visit (HOSPITAL_BASED_OUTPATIENT_CLINIC_OR_DEPARTMENT_OTHER): Payer: Medicare PPO | Admitting: Family

## 2022-10-19 VITALS — BP 139/81 | HR 73 | Resp 18 | Wt 138.5 lb

## 2022-10-19 DIAGNOSIS — I5032 Chronic diastolic (congestive) heart failure: Secondary | ICD-10-CM

## 2022-10-19 DIAGNOSIS — I480 Paroxysmal atrial fibrillation: Secondary | ICD-10-CM | POA: Diagnosis not present

## 2022-10-19 DIAGNOSIS — I10 Essential (primary) hypertension: Secondary | ICD-10-CM | POA: Diagnosis not present

## 2022-10-19 DIAGNOSIS — J449 Chronic obstructive pulmonary disease, unspecified: Secondary | ICD-10-CM | POA: Diagnosis not present

## 2022-10-19 LAB — BASIC METABOLIC PANEL
Anion gap: 9 (ref 5–15)
BUN: 20 mg/dL (ref 8–23)
CO2: 26 mmol/L (ref 22–32)
Calcium: 9.8 mg/dL (ref 8.9–10.3)
Chloride: 100 mmol/L (ref 98–111)
Creatinine, Ser: 0.92 mg/dL (ref 0.44–1.00)
GFR, Estimated: >60 mL/min (ref >60)
Glucose, Bld: 101 mg/dL — ABNORMAL HIGH (ref 70–99)
Potassium: 4.1 mmol/L (ref 3.5–5.1)
Sodium: 135 mmol/L (ref 135–145)

## 2022-10-19 NOTE — Progress Notes (Signed)
Patient ID: Christina Carr, female    DOB: Dec 17, 1945, 77 y.o.   MRN: [id number]  HPI  [name] is a 77 y/o female with a history of atrial fibrillation, hyperlipidemia, HTN, thyroid disease, anemia, anxiety, COPD, depression, tobacco use and chronic heart failure.   Echo report from 09/10/22 showed an EF of 50-55% along with mild LVH/ LAE and trivial MR.   Was in the ED 10/02/22 due to mechanical fall. Admitted 09/09/22 due to acute on chronic HF with AF RVR. Given IV lasix and diltiazem drip. Discharged after 3 days. Was in the ED 06/22/22 due to AF.   She presents today for a follow-up visit with a chief complaint of minimal fatigue with moderate exertion. Describes this as chronic. Has associated chest wall pain due to a recent fall. Denies any difficulty sleeping, abdominal distention, palpitations, pedal edema, chest pain, wheezing, shortness of breath, cough, dizziness or weight gain.   Says that since starting jardiance she feels "much better". Checks her BP at home and says that it runs 110's/70's.  Recent fall was not due to dizziness. She was trying to grab her grocery store buggy and her feet got tangled up with each other.   Past Medical History:  Diagnosis Date   Anemia    Anxiety    Arrhythmia    atrial fibrillation   Arthritis    osteoporosis too   Cataracts, bilateral    Cervical disc herniation    CHF (congestive heart failure) (HCC)    COPD (chronic obstructive pulmonary disease) (HCC)    mild emphysema   Depression    Goiter    Hypercholesteremia    Hypertension    8/18 Stress and Echo in 04/2017 WNL   Hypothyroidism    Osteoporosis    Personal history of tobacco use, presenting hazards to health 12/29/2015   PONV (postoperative nausea and vomiting)    only with hysterectomy   Thyroid disease    Past Surgical History:  Procedure Laterality Date   ABDOMINAL HYSTERECTOMY     AUGMENTATION MAMMAPLASTY Bilateral 6967   silicone   CATARACT EXTRACTION W/PHACO  Left 07/12/2017   Procedure: CATARACT EXTRACTION PHACO AND INTRAOCULAR LENS PLACEMENT (IOC)-LEFT;  Surgeon: Eulogio Bear, MD;  Location: [facility] ORS;  Service: Ophthalmology;  Laterality: Left;  Lot # [id number] H US:00:26.7 AP%: 8.0 CDE: 2.14    CATARACT EXTRACTION W/PHACO Right 08/02/2017   Procedure: CATARACT EXTRACTION PHACO AND INTRAOCULAR LENS PLACEMENT (IOC)-RIGHT PRE DIABETIC;  Surgeon: Eulogio Bear, MD;  Location: [facility] ORS;  Service: Ophthalmology;  Laterality: Right;  [place] 00:19.9 AP% 12.8 CDE 2.55 [employer] lot # [id number]   CESAREAN SECTION     COLONOSCOPY     COLONOSCOPY WITH PROPOFOL N/A 04/29/2018   Procedure: COLONOSCOPY WITH PROPOFOL;  Surgeon: Lollie Sails, MD;  Location: [facility] END[facility]COPY;  Service: Endoscopy;  Laterality: N/A;   EYE SURGERY Left 06/2017   cataract extraction   TUBAL LIGATION     Family History  Problem Relation Age of Onset   Cancer Mother        cervical   Cirrhosis Father    Hypertension Sister    Cancer Sister 106       cervical   Breast cancer Neg Hx    Social History   Tobacco Use   Smoking status: Former   Smokeless tobacco: Never  Substance Use Topics   Alcohol use: No   Allergies  Allergen Reactions   Alendronate     Chest  pain   Hydrochlorothiazide    Sulfa Antibiotics Nausea And Vomiting   Prior to Admission medications   Medication Sig Start Date End Date Taking? Authorizing Provider  apixaban (ELIQUIS) 5 MG TABS tablet Take 1 tablet (5 mg total) by mouth 2 (two) times daily. 06/23/22  Yes Darlin Priestly, MD  Coenzyme Q10 (CO Q 10) 10 MG CAPS Take 200 mg by mouth daily.   Yes [provider]  diltiazem (CARDIZEM CD) 360 MG 24 hr capsule Take 1 capsule (360 mg total) by mouth daily. 09/12/22  Yes Marrion Coy, MD  donepezil (ARICEPT) 5 MG tablet Take 5 mg by mouth at bedtime. 04/10/22  Yes [provider]  DULoxetine (CYMBALTA) 20 MG capsule Take 20 mg by mouth daily. 05/09/22  Yes [provider]   empagliflozin (JARDIANCE) 10 MG TABS tablet Take 1 tablet (10 mg total) by mouth daily before breakfast. 10/13/22  Yes [name] A, FNP  Glucosamine Sulfate 1000 MG CAPS Take 1,000 mg by mouth daily. After breakfast and after supper.   Yes [provider]  levothyroxine (SYNTHROID) 88 MCG tablet Take 88 mcg by mouth daily before breakfast. 07/04/22 07/04/23 Yes [provider]  melatonin 5 MG TABS Take 10 mg by mouth at bedtime as needed.   Yes [provider]  metoprolol tartrate (LOPRESSOR) 25 MG tablet Take 1 tablet (25 mg total) by mouth 2 (two) times daily. 06/23/22  Yes Darlin Priestly, MD  Multiple Vitamin (MULTIVITAMIN WITH MINERALS) TABS tablet Take 1 tablet by mouth daily.   Yes [provider]  Probiotic Product (ALIGN PO) Take 1 capsule by mouth every evening.   Yes [provider]  senna-docusate (SENOKOT-S) 8.6-50 MG tablet Take 2 tablets by mouth 2 (two) times daily as needed for mild constipation. 09/12/22  Yes Marrion Coy, MD  simvastatin (ZOCOR) 20 MG tablet Take 20 mg by mouth daily at 8 pm. 2100   Yes [provider]   Review of Systems  Constitutional:  Positive for fatigue. Negative for appetite change.  HENT:  Positive for hearing loss. Negative for congestion and sore throat.   Eyes: Negative.   Respiratory:  Negative for cough, chest tightness, shortness of breath and wheezing.   Cardiovascular:  Negative for chest pain, palpitations and leg swelling.  Gastrointestinal:  Negative for abdominal distention and abdominal pain.  Endocrine: Negative.   Genitourinary: Negative.   Musculoskeletal:  Positive for arthralgias (chest wall pain due to recent fall). Negative for back pain and neck pain.  Skin: Negative.   Allergic/Immunologic: Negative.   Neurological:  Negative for dizziness and light-headedness.  Hematological:  Negative for adenopathy. Does not bruise/bleed easily.  Psychiatric/Behavioral:  Negative for  dysphoric mood and sleep disturbance (sleeping on 1 pillow). The patient is not nervous/anxious.    Vitals:   10/19/22 1055  BP: 139/81  Pulse: 73  Resp: 18  SpO2: 98%  Weight: 138 lb 8 oz (62.8 kg)   Wt Readings from Last 3 Encounters:  10/19/22 138 lb 8 oz (62.8 kg)  09/28/22 139 lb 6 oz (63.2 kg)  09/12/22 141 lb 15.6 oz (64.4 kg)   Lab Results  Component Value Date   CREATININE 0.97 09/12/2022   CREATININE 1.03 (H) 09/11/2022   CREATININE 0.86 09/10/2022   Physical Exam Vitals and nursing note reviewed.  Constitutional:      Appearance: Normal appearance.  HENT:     Head: Normocephalic and atraumatic.  Cardiovascular:     Rate and Rhythm:  Normal rate and regular rhythm.  Pulmonary:     Effort: Pulmonary effort is normal. No respiratory distress.     Breath sounds: No wheezing, rhonchi or rales.  Abdominal:     General: Abdomen is flat. There is no distension.     Palpations: Abdomen is soft.  Musculoskeletal:        General: No tenderness.     Cervical back: Normal range of motion and neck supple.     Right lower leg: No edema.     Left lower leg: No edema.  Skin:    General: Skin is warm and dry.  Neurological:     General: No focal deficit present.     Mental Status: She is alert and oriented to person, place, and time.  Psychiatric:        Mood and Affect: Mood normal.        Behavior: Behavior normal.        Thought Content: Thought content normal.   Assessment & Plan:  1: Chronic heart failure with preserved ejection fraction with structural changes (LVH/ LAE)- - NYHA class II - euvolemic  - weighing daily; reminded to call for an overnight weight gain of > 2 pounds or a weekly weight gain of > 5 pounds - weight down 1 pound from last visit here 3 weeks ago - not adding salt to her food - drinking 1 cup of coffee, milk and some water - GDMT of jardiance - BMP today as jardiance started at last visit - consider entresto but want to know what her  home BP's are looking like first - BNP 09/09/22 was 523.0 - PharmD reconciled meds w/ patient  2: HTN- - BP 139/81; she reports home BP runs 110's/70's - BP log provided and instructed her to write her readings down and bring them to next visit or she can also send through mychart to me - saw PCP (Hande) 09/19/22; returns 11/03/22 - BMP 09/12/22 showed sodium 136, potassium 3.6, creatinine 0.97 & GFR >60  3: AF- - to see cardiology Clayborn Bigness) on 11/01/22 - taking apixaban, diltiazem and metoprolol tartrate  4: COPD- - stopped smoking ~ 2013   Medication list reviewed.   Return in 6 weeks, sooner if needed.

## 2022-10-19 NOTE — Patient Instructions (Addendum)
Continue weighing daily and call for an overnight weight gain of 3 pounds or more or a weekly weight gain of more than 5 pounds.   If you have voicemail, please make sure your mailbox is cleaned out so that we may leave a message and please make sure to listen to any voicemails.    Bring your blood pressure log with you to your next appointment

## 2022-10-19 NOTE — Progress Notes (Signed)
[facility] - PHARMACIST COUNSELING NOTE  Guideline-Directed Medical Therapy/Evidence Based Medicine  ACE/ARB/ARNI:  None Beta Blocker: Metoprolol tartrate 25 mg twice daily Aldosterone Antagonist:  None Diuretic:  None SGLT2i: Empagliflozin 10 mg daily  Adherence Assessment  Do you ever forget to take your medication? [] Yes [x] No  Do you ever skip doses due to side effects? [] Yes [x] No  Do you have trouble affording your medicines? [x] Yes [] No  Are you ever unable to pick up your medication due to transportation difficulties? [] Yes [x] No  Do you ever stop taking your medications because you don't believe they are helping? [] Yes [x] No  Do you check your weight daily? [x] Yes [] No   Adherence strategy: Pill boxes, and writes the time of day she takes them on the lids of her pill bottles. She also uses Alexa alarms.  Barriers to obtaining medications: Her brand name medications (Eliquis, Jardiance) are sent to [facility] that enables her to pay a lower copay for 90 day supplies.  Vital signs: HR 73, BP 139/81, weight (pounds) 138 ECHO: Date 08/2022, EF 50-55%     Latest Ref Rng & Units 10/19/2022   11:19 AM 09/12/2022    5:03 AM 09/11/2022    9:42 AM  BMP  Glucose 70 - 99 mg/dL 101  102  168   BUN 8 - 23 mg/dL 20  18  15    Creatinine 0.44 - 1.00 mg/dL 0.92  0.97  1.03   Sodium 135 - 145 mmol/L 135  136  136   Potassium 3.5 - 5.1 mmol/L 4.1  3.6  4.0   Chloride 98 - 111 mmol/L 100  96  98   CO2 22 - 32 mmol/L 26  34  29   Calcium 8.9 - 10.3 mg/dL 9.8  8.8  9.2     Past Medical History:  Diagnosis Date   Anemia    Anxiety    Arrhythmia    atrial fibrillation   Arthritis    osteoporosis too   Cataracts, bilateral    Cervical disc herniation    CHF (congestive heart failure) (HCC)    COPD (chronic obstructive pulmonary disease) (HCC)    mild emphysema   Depression    Goiter    Hypercholesteremia     Hypertension    8/18 Stress and Echo in 04/2017 WNL   Hypothyroidism    Osteoporosis    Personal history of tobacco use, presenting hazards to health 12/29/2015   PONV (postoperative nausea and vomiting)    only with hysterectomy   Thyroid disease     ASSESSMENT 77 year old female with PMH HTN, Afib, COPD, HLD who presents to the HF clinic for follow-up. Most recent ECHO in 08/2022 shows EF 50-55%. Regarding GDMT, patient takes Jardiance 10 mg daily and Lopressor 25 mg twice daily. Patient had a fall at the grocery store this month, but apparently this was not due to hypotension as she denied dizziness or lightheadedness.   Notably, patient may experience white coat hypertension >> her BPs in the clinic are consistently above goal but at home they are reportedly around 110/70. I provided patient with a new BP log and asked her to start writing them down and bring them with her to her next appointment. With recent fall (particularly given her apixaban) and possibly low blood pressures at home, this makes it difficult to add on additional GDMT like an MRA.  Regarding recent initiation of Jardiance, patient endorses noticing an immediate  improvement in how she feels. She has been very pleased with Jardiance.  Recent ED Visit (past 6 months): Date - 10/02/2022, CC - Mechanical fall Date - 09/09/2022, CC - Afib w/ RVR Date - 06/22/2022, CC - Afib  PLAN CHF/HTN Recommend checking BMP today since Jardiance started on 09/28/2022 to check if any impact on renal function Continue Jardiance and metoprolol tartrate  Afib CHADSVASc 5 Continue apixaban, metoprolol tartrate, and diltiazem  HLD 05/2022 LDL 84 Continue simvastatin Patient reports terrible leg cramps that have been totally cured by starting a CoQ10 supplement  Time spent: 20 minutes  Will M. Dareen Piano, PharmD PGY-1 Pharmacy Resident 10/19/2022 6:34 PM   Current Outpatient Medications:    apixaban (ELIQUIS) 5 MG TABS tablet, Take 1  tablet (5 mg total) by mouth 2 (two) times daily., Disp: 60 tablet, Rfl: 2   Coenzyme Q10 (CO Q 10) 10 MG CAPS, Take 200 mg by mouth daily., Disp: , Rfl:    diltiazem (CARDIZEM CD) 360 MG 24 hr capsule, Take 1 capsule (360 mg total) by mouth daily., Disp: 30 capsule, Rfl: 0   donepezil (ARICEPT) 5 MG tablet, Take 5 mg by mouth at bedtime., Disp: , Rfl:    DULoxetine (CYMBALTA) 20 MG capsule, Take 20 mg by mouth daily., Disp: , Rfl:    empagliflozin (JARDIANCE) 10 MG TABS tablet, Take 1 tablet (10 mg total) by mouth daily before breakfast., Disp: 90 tablet, Rfl: 3   Glucosamine Sulfate 1000 MG CAPS, Take 1,000 mg by mouth daily. After breakfast and after supper., Disp: , Rfl:    levothyroxine (SYNTHROID) 88 MCG tablet, Take 88 mcg by mouth daily before breakfast., Disp: , Rfl:    melatonin 5 MG TABS, Take 10 mg by mouth at bedtime as needed., Disp: , Rfl:    metoprolol tartrate (LOPRESSOR) 25 MG tablet, Take 1 tablet (25 mg total) by mouth 2 (two) times daily., Disp: 60 tablet, Rfl: 2   Multiple Vitamin (MULTIVITAMIN WITH MINERALS) TABS tablet, Take 1 tablet by mouth daily., Disp: , Rfl:    Probiotic Product (ALIGN PO), Take 1 capsule by mouth every evening., Disp: , Rfl:    senna-docusate (SENOKOT-S) 8.6-50 MG tablet, Take 2 tablets by mouth 2 (two) times daily as needed for mild constipation., Disp: 100 tablet, Rfl: 0   simvastatin (ZOCOR) 20 MG tablet, Take 20 mg by mouth daily at 8 pm. 2100, Disp: , Rfl:   DRUGS TO CAUTION IN HEART FAILURE  Drug or Class Mechanism  Analgesics NSAIDs COX-2 inhibitors Glucocorticoids  Sodium and water retention, increased systemic vascular resistance, decreased response to diuretics   Diabetes Medications Metformin Thiazolidinediones Rosiglitazone (Avandia) Pioglitazone (Actos) DPP4 Inhibitors Saxagliptin (Onglyza) Sitagliptin (Januvia)   Lactic acidosis Possible calcium channel blockade   Unknown  Antiarrhythmics Class I   Flecainide Disopyramide Class III Sotalol Other Dronedarone  Negative inotrope, proarrhythmic   Proarrhythmic, beta blockade  Negative inotrope  Antihypertensives Alpha Blockers Doxazosin Calcium Channel Blockers Diltiazem Verapamil Nifedipine Central Alpha Adrenergics Moxonidine Peripheral Vasodilators Minoxidil  Increases renin and aldosterone  Negative inotrope    Possible sympathetic withdrawal  Unknown  Anti-infective Itraconazole Amphotericin B  Negative inotrope Unknown  Hematologic Anagrelide Cilostazol   Possible inhibition of PD IV Inhibition of PD III causing arrhythmias  Neurologic/Psychiatric Stimulants Anti-Seizure Drugs Carbamazepine Pregabalin Antidepressants Tricyclics Citalopram Parkinsons Bromocriptine Pergolide Pramipexole Antipsychotics Clozapine Antimigraine Ergotamine Methysergide Appetite suppressants Bipolar Lithium  Peripheral alpha and beta agonist activity  Negative inotrope and chronotrope Calcium channel blockade  Negative inotrope,  proarrhythmic Dose-dependent QT prolongation  Excessive serotonin activity/valvular damage Excessive serotonin activity/valvular damage Unknown  IgE mediated hypersensitivy, calcium channel blockade  Excessive serotonin activity/valvular damage Excessive serotonin activity/valvular damage Valvular damage  Direct myofibrillar degeneration, adrenergic stimulation  Antimalarials Chloroquine Hydroxychloroquine Intracellular inhibition of lysosomal enzymes  Urologic Agents Alpha Blockers Doxazosin Prazosin Tamsulosin Terazosin  Increased renin and aldosterone  Adapted from Page Carleene Overlie, et al. "Drugs That May Cause or Exacerbate Heart Failure: A Scientific Statement from the American Heart  Association." Circulation 2016; 134:e32-e69. DOI: 10.1161/CIR.0000000000000426   MEDICATION ADHERENCES TIPS AND STRATEGIES Taking medication as prescribed improves patient outcomes in  heart failure (reduces hospitalizations, improves symptoms, increases survival) Side effects of medications can be managed by decreasing doses, switching agents, stopping drugs, or adding additional therapy. Please let someone in the [facility] know if you have having bothersome side effects so we can modify your regimen. Do not alter your medication regimen without talking to [place].  Medication reminders can help patients remember to take drugs on time. If you are missing or forgetting doses you can try linking behaviors, using pill boxes, or an electronic reminder like an alarm on your phone or an app. Some people can also get automated phone calls as medication reminders.

## 2022-11-29 ENCOUNTER — Ambulatory Visit: Payer: Medicare PPO | Attending: Family | Admitting: Family

## 2022-11-29 ENCOUNTER — Encounter: Payer: Self-pay | Admitting: Family

## 2022-11-29 VITALS — BP 123/79 | HR 70 | Wt 135.0 lb

## 2022-11-29 DIAGNOSIS — I5032 Chronic diastolic (congestive) heart failure: Secondary | ICD-10-CM | POA: Insufficient documentation

## 2022-11-29 DIAGNOSIS — I110 Hypertensive heart disease with heart failure: Secondary | ICD-10-CM | POA: Insufficient documentation

## 2022-11-29 DIAGNOSIS — R42 Dizziness and giddiness: Secondary | ICD-10-CM | POA: Insufficient documentation

## 2022-11-29 DIAGNOSIS — Z7984 Long term (current) use of oral hypoglycemic drugs: Secondary | ICD-10-CM | POA: Insufficient documentation

## 2022-11-29 DIAGNOSIS — Z7901 Long term (current) use of anticoagulants: Secondary | ICD-10-CM | POA: Insufficient documentation

## 2022-11-29 DIAGNOSIS — R5383 Other fatigue: Secondary | ICD-10-CM | POA: Diagnosis not present

## 2022-11-29 DIAGNOSIS — I4891 Unspecified atrial fibrillation: Secondary | ICD-10-CM | POA: Insufficient documentation

## 2022-11-29 DIAGNOSIS — I10 Essential (primary) hypertension: Secondary | ICD-10-CM

## 2022-11-29 DIAGNOSIS — J449 Chronic obstructive pulmonary disease, unspecified: Secondary | ICD-10-CM | POA: Insufficient documentation

## 2022-11-29 DIAGNOSIS — F419 Anxiety disorder, unspecified: Secondary | ICD-10-CM | POA: Diagnosis not present

## 2022-11-29 DIAGNOSIS — I480 Paroxysmal atrial fibrillation: Secondary | ICD-10-CM

## 2022-11-29 DIAGNOSIS — R0602 Shortness of breath: Secondary | ICD-10-CM | POA: Diagnosis not present

## 2022-11-29 DIAGNOSIS — F32A Depression, unspecified: Secondary | ICD-10-CM | POA: Diagnosis not present

## 2022-11-29 DIAGNOSIS — Z87891 Personal history of nicotine dependence: Secondary | ICD-10-CM | POA: Insufficient documentation

## 2022-11-29 DIAGNOSIS — E785 Hyperlipidemia, unspecified: Secondary | ICD-10-CM | POA: Insufficient documentation

## 2022-11-29 DIAGNOSIS — Z79899 Other long term (current) drug therapy: Secondary | ICD-10-CM | POA: Insufficient documentation

## 2022-11-29 NOTE — Patient Instructions (Signed)
Stop taking the lasix daily and only take it if you gain more than 2 pounds overnight.

## 2022-11-29 NOTE — Progress Notes (Signed)
Patient ID: Christina Carr, female    DOB: 01-23-1946, 77 y.o.   MRN: [id number]  HPI  Ms Christina Carr is a 77 y/o female with a history of atrial fibrillation, hyperlipidemia, HTN, thyroid disease, anemia, anxiety, COPD, depression, tobacco use and chronic heart failure.   Echo report from 09/10/22 showed an EF of 50-55% along with mild LVH/ LAE and trivial MR.   Was in the ED 10/02/22 due to mechanical fall. Admitted 09/09/22 due to acute on chronic HF with AF RVR. Given IV lasix and diltiazem drip. Discharged after 3 days. Was in the ED 06/22/22 due to AF.   She presents today for a HF follow-up visit with a chief complaint of moderate fatigue with little exertion. Describes this as chronic in nature although is worsening. She has associated minimal SOB and dizziness (since med changes) along with this. Denies any difficulty sleeping, abdominal distention, palpitations, pedal edema, chest pain, wheezing, cough or weight gain.   Says that since her cardiologist changed her medications, she feels more fatigued along with being frequently dizzy even without any change in positions. She voices frustration that she feels so bad and wants to get feeling better before summer gets here.   Says that she has never been cardioverted before.   Past Medical History:  Diagnosis Date   Anemia    Anxiety    Arrhythmia    atrial fibrillation   Arthritis    osteoporosis too   Cataracts, bilateral    Cervical disc herniation    CHF (congestive heart failure) (HCC)    COPD (chronic obstructive pulmonary disease) (HCC)    mild emphysema   Depression    Goiter    Hypercholesteremia    Hypertension    8/18 Stress and Echo in 04/2017 WNL   Hypothyroidism    Osteoporosis    Personal history of tobacco use, presenting hazards to health 12/29/2015   PONV (postoperative nausea and vomiting)    only with hysterectomy   Thyroid disease    Past Surgical History:  Procedure Laterality Date   ABDOMINAL  HYSTERECTOMY     AUGMENTATION MAMMAPLASTY Bilateral [id number]   silicone   CATARACT EXTRACTION W/PHACO Left 07/12/2017   Procedure: CATARACT EXTRACTION PHACO AND INTRAOCULAR LENS PLACEMENT (IOC)-LEFT;  Surgeon: Eulogio Bear, MD;  Location: [facility] ORS;  Service: Ophthalmology;  Laterality: Left;  Lot # [id number] H US:00:26.7 AP%: 8.0 CDE: 2.14    CATARACT EXTRACTION W/PHACO Right 08/02/2017   Procedure: CATARACT EXTRACTION PHACO AND INTRAOCULAR LENS PLACEMENT (IOC)-RIGHT PRE DIABETIC;  Surgeon: Eulogio Bear, MD;  Location: [facility] ORS;  Service: Ophthalmology;  Laterality: Right;  [place] 00:19.9 AP% 12.8 CDE 2.55 [employer] lot # [id number]   CESAREAN SECTION     COLONOSCOPY     COLONOSCOPY WITH PROPOFOL N/A 04/29/2018   Procedure: COLONOSCOPY WITH PROPOFOL;  Surgeon: Lollie Sails, MD;  Location: [facility] ENDOSCOPY;  Service: Endoscopy;  Laterality: N/A;   EYE SURGERY Left 06/2017   cataract extraction   TUBAL LIGATION     Family History  Problem Relation Age of Onset   Cancer Mother        cervical   Cirrhosis Father    Hypertension Sister    Cancer Sister 52       cervical   Breast cancer Neg Hx    Social History   Tobacco Use   Smoking status: Former   Smokeless tobacco: Never  Substance Use Topics   Alcohol use: No   Allergies  Allergen Reactions   Alendronate     Chest pain   Hydrochlorothiazide    Sulfa Antibiotics Nausea And Vomiting   Prior to Admission medications   Medication Sig Start Date End Date Taking? Authorizing Provider  apixaban (ELIQUIS) 5 MG TABS tablet Take 1 tablet (5 mg total) by mouth 2 (two) times daily. 06/23/22  Yes Enzo Bi, MD  Coenzyme Q10 (CO Q 10) 10 MG CAPS Take 200 mg by mouth daily.   Yes [provider]  diltiazem (CARDIZEM CD) 360 MG 24 hr capsule Take 1 capsule (360 mg total) by mouth daily. Patient taking differently: Take 180 mg by mouth daily. 09/12/22  Yes Sharen Hones, MD  donepezil (ARICEPT) 5 MG tablet Take 5 mg by  mouth at bedtime. 04/10/22  Yes [provider]  DULoxetine (CYMBALTA) 20 MG capsule Take 20 mg by mouth daily. 05/09/22  Yes [provider]  empagliflozin (JARDIANCE) 10 MG TABS tablet Take 1 tablet (10 mg total) by mouth daily before breakfast. 10/13/22  Yes Darylene Price A, FNP  furosemide (LASIX) 20 MG tablet Take 20 mg by mouth daily.   Yes [provider]  Glucosamine Sulfate 1000 MG CAPS Take 1,000 mg by mouth daily. After breakfast and after supper.   Yes [provider]  levothyroxine (SYNTHROID) 88 MCG tablet Take 88 mcg by mouth daily before breakfast. 07/04/22 07/04/23 Yes [provider]  loratadine (CLARITIN) 10 MG tablet Take 10 mg by mouth daily as needed for allergies.   Yes [provider]  melatonin 5 MG TABS Take 10 mg by mouth at bedtime as needed.   Yes [provider]  metoprolol tartrate (LOPRESSOR) 25 MG tablet Take 1 tablet (25 mg total) by mouth 2 (two) times daily. Patient taking differently: Take 50 mg by mouth 2 (two) times daily. 06/23/22  Yes Enzo Bi, MD  Multiple Vitamin (MULTIVITAMIN WITH MINERALS) TABS tablet Take 1 tablet by mouth daily.   Yes [provider]  senna-docusate (SENOKOT-S) 8.6-50 MG tablet Take 2 tablets by mouth 2 (two) times daily as needed for mild constipation. 09/12/22  Yes Sharen Hones, MD  simvastatin (ZOCOR) 20 MG tablet Take 20 mg by mouth daily at 8 pm. 2100   Yes [provider]  Probiotic Product (ALIGN PO) Take 1 capsule by mouth every evening. Patient not taking: Reported on 11/29/2022    [provider]   Review of Systems  Constitutional:  Positive for fatigue. Negative for appetite change.  HENT:  Positive for hearing loss. Negative for congestion and sore throat.   Eyes: Negative.   Respiratory:  Positive for shortness of breath. Negative for cough, chest tightness and wheezing.   Cardiovascular:  Negative for chest pain, palpitations and leg  swelling.  Gastrointestinal:  Negative for abdominal distention and abdominal pain.  Endocrine: Negative.   Genitourinary: Negative.   Musculoskeletal:  Negative for back pain and neck pain.  Skin: Negative.   Allergic/Immunologic: Negative.   Neurological:  Positive for dizziness (frequently). Negative for light-headedness.  Hematological:  Negative for adenopathy. Does not bruise/bleed easily.  Psychiatric/Behavioral:  Negative for dysphoric mood and sleep disturbance (sleeping on 1 pillow). The patient is not nervous/anxious.    Vitals:   11/29/22 1136  BP: 123/79  Pulse: 70  SpO2: 100%  Weight: 135 lb (61.2 kg)   Wt Readings from Last 3 Encounters:  11/29/22 135 lb (61.2 kg)  10/19/22 138 lb 8 oz (62.8 kg)  09/28/22 139 lb 6 oz (  63.2 kg)   Lab Results  Component Value Date   CREATININE 0.92 10/19/2022   CREATININE 0.97 09/12/2022   CREATININE 1.03 (H) 09/11/2022   Physical Exam Vitals and nursing note reviewed.  Constitutional:      Appearance: Normal appearance.  HENT:     Head: Normocephalic and atraumatic.  Cardiovascular:     Rate and Rhythm: Normal rate. Rhythm irregular.  Pulmonary:     Effort: Pulmonary effort is normal. No respiratory distress.     Breath sounds: No wheezing, rhonchi or rales.  Abdominal:     General: Abdomen is flat. There is no distension.     Palpations: Abdomen is soft.  Musculoskeletal:        General: No tenderness.     Cervical back: Normal range of motion and neck supple.     Right lower leg: No edema.     Left lower leg: No edema.  Skin:    General: Skin is warm and dry.  Neurological:     General: No focal deficit present.     Mental Status: She is alert and oriented to person, place, and time.  Psychiatric:        Mood and Affect: Mood normal.        Behavior: Behavior normal.        Thought Content: Thought content normal.   Assessment & Plan:  1: Chronic heart failure with preserved ejection fraction with structural  changes (LVH/ LAE)- - NYHA class III - euvolemic  - weighing daily & weight chart shows range of 131-138 over the last month; reminded to call for an overnight weight gain of > 2 pounds or a weekly weight gain of > 5 pounds - weight down 3 pounds from last visit here 1 month ago - echo 09/10/22: EF of 50-55% along with mild LVH/ LAE and trivial MR.  - not adding salt to her food - drinking 1 cup of coffee, milk and some water - jardiance '10mg'$  daily - stop taking furosemide daily but take it PRN for above weight gain or swelling - BNP 09/09/22 was 523.0  2: HTN- - BP 123/79; home BP log shows 87-122/59-85 over the last severe weeks - saw PCP ([facility]) 11/02/22 - BMP 10/27/22 showed sodium 137, potassium 4.5, creatinine 1.0 & GFR 58  3: AF- - saw cardiology ([place]) 11/01/22 and diltiazem was decreased & metoprolol increased - EKG today shows atrial fibrillation - referral to AHDF provider to evaluate for possible cardioversion - home HR ranges from 40-104 over last several weeks - apixaban '5mg'$  BID - diltiazem '180mg'$  daily - metoprolol tartrate '50mg'$  BID  4: COPD- - stopped smoking ~ 2013   Medication bottles reviewed.   Return in 2 weeks, sooner if needed

## 2022-12-12 ENCOUNTER — Ambulatory Visit (HOSPITAL_BASED_OUTPATIENT_CLINIC_OR_DEPARTMENT_OTHER): Payer: Medicare PPO | Admitting: Cardiology

## 2022-12-12 ENCOUNTER — Encounter: Payer: Self-pay | Admitting: *Deleted

## 2022-12-12 ENCOUNTER — Other Ambulatory Visit: Payer: Self-pay | Admitting: Cardiology

## 2022-12-12 ENCOUNTER — Other Ambulatory Visit
Admission: RE | Admit: 2022-12-12 | Discharge: 2022-12-12 | Disposition: A | Discharge status: Home / Self Care | Payer: Medicare PPO | Source: Ambulatory Visit | Attending: Cardiology | Admitting: Cardiology

## 2022-12-12 ENCOUNTER — Encounter: Payer: Self-pay | Admitting: Cardiology

## 2022-12-12 ENCOUNTER — Telehealth: Payer: Self-pay | Admitting: *Deleted

## 2022-12-12 VITALS — BP 95/79 | HR 86 | Wt 136.8 lb

## 2022-12-12 DIAGNOSIS — I480 Paroxysmal atrial fibrillation: Secondary | ICD-10-CM

## 2022-12-12 DIAGNOSIS — I5032 Chronic diastolic (congestive) heart failure: Secondary | ICD-10-CM | POA: Insufficient documentation

## 2022-12-12 DIAGNOSIS — I10 Essential (primary) hypertension: Secondary | ICD-10-CM | POA: Diagnosis not present

## 2022-12-12 LAB — BASIC METABOLIC PANEL
Anion gap: 8 (ref 5–15)
BUN: 18 mg/dL (ref 8–23)
CO2: 26 mmol/L (ref 22–32)
Calcium: 9.3 mg/dL (ref 8.9–10.3)
Chloride: 103 mmol/L (ref 98–111)
Creatinine, Ser: 1.04 mg/dL — ABNORMAL HIGH (ref 0.44–1.00)
GFR, Estimated: 56 mL/min — ABNORMAL LOW (ref >60)
Glucose, Bld: 108 mg/dL — ABNORMAL HIGH (ref 70–99)
Potassium: 4 mmol/L (ref 3.5–5.1)
Sodium: 137 mmol/L (ref 135–145)

## 2022-12-12 LAB — BRAIN NATRIURETIC PEPTIDE: B Natriuretic Peptide: 276.9 pg/mL — ABNORMAL HIGH (ref 0.0–100.0)

## 2022-12-12 NOTE — Patient Instructions (Signed)
Routine lab work today. Will notify you of abnormal results  Your provider requests you have pulmonary function tests. (We will contact you to schedule your appointment)  Do the following things EVERYDAY: Weigh yourself in the morning before breakfast. Write it down and keep it in a log. Take your medicines as prescribed Eat low salt foods--Limit salt (sodium) to 2000 mg per day.  Stay as active as you can everyday Limit all fluids for the day to less than 2 liters   Your physician has requested that you have a TEE/Cardioversion. During a TEE, sound waves are used to create images of your heart. It provides your doctor with information about the size and shape of your heart and how well your heart's chambers and valves are working. In this test, a transducer is attached to the end of a flexible tube that is guided down you throat and into your esophagus (the tube leading from your mouth to your stomach) to get a more detailed image of your heart. Once the TEE has determined that a blood clot is not present, the cardioversion begins. Electrical Cardioversion uses a jolt of electricity to your heart either through paddles or wired patches attached to your chest. This is a controlled, usually prescheduled, procedure. This procedure is done at the hospital and you are not awake during the procedure. You usually go home the day of the procedure. Please see the instruction sheet given to you today for more information.   We will call you appointment time and instructions  Follow up in 6 weeks
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
## 2022-12-12 NOTE — Telephone Encounter (Signed)
TEE auth Approved Authorization [id number]  No pre cert reqd for dccv
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
## 2022-12-20 ENCOUNTER — Telehealth: Payer: Self-pay | Admitting: *Deleted

## 2022-12-20 NOTE — Telephone Encounter (Signed)
Pt called to cancel TEE/DCCV because she does not have anyone to drive her. I will r/s and contact pt with new appointment.

## 2022-12-22 ENCOUNTER — Ambulatory Visit (HOSPITAL_COMMUNITY): Admission: RE | Admit: 2022-12-22 | Payer: Medicare PPO | Source: Home / Self Care | Admitting: Cardiology

## 2022-12-22 ENCOUNTER — Encounter (HOSPITAL_COMMUNITY): Admission: RE | Payer: Self-pay | Source: Home / Self Care

## 2022-12-22 SURGERY — ECHOCARDIOGRAM, TRANSESOPHAGEAL
Anesthesia: General

## 2022-12-28 ENCOUNTER — Ambulatory Visit: Payer: Medicare PPO | Attending: Cardiology

## 2022-12-28 DIAGNOSIS — I5032 Chronic diastolic (congestive) heart failure: Secondary | ICD-10-CM | POA: Diagnosis not present

## 2022-12-28 LAB — PULMONARY FUNCTION TEST ARMC ONLY
DL/VA % pred: 74 %
DL/VA: 3.06 ml/min/mmHg/L
DLCO unc % pred: 68 %
DLCO unc: 13.09 ml/min/mmHg
FEF 25-75 Post: 1.33 L/sec
FEF 25-75 Pre: 1.07 L/sec
FEF2575-%Change-Post: 24 %
FEF2575-%Pred-Post: 82 %
FEF2575-%Pred-Pre: 66 %
FEV1-%Change-Post: 0 %
FEV1-%Pred-Post: 81 %
FEV1-%Pred-Pre: 81 %
FEV1-Post: 1.7 L
FEV1-Pre: 1.69 L
FEV1FVC-%Change-Post: -5 %
FEV1FVC-%Pred-Pre: 94 %
FEV6-%Change-Post: 6 %
FEV6-%Pred-Post: 97 %
FEV6-%Pred-Pre: 90 %
FEV6-Post: 2.56 L
FEV6-Pre: 2.4 L
FEV6FVC-%Change-Post: 0 %
FEV6FVC-%Pred-Post: 105 %
FEV6FVC-%Pred-Pre: 105 %
FVC-%Change-Post: 6 %
FVC-%Pred-Post: 92 %
FVC-%Pred-Pre: 86 %
FVC-Post: 2.56 L
FVC-Pre: 2.4 L
Post FEV1/FVC ratio: 66 %
Post FEV6/FVC ratio: 100 %
Pre FEV1/FVC ratio: 70 %
Pre FEV6/FVC Ratio: 100 %
RV % pred: 111 %
RV: 2.57 L
TLC % pred: 99 %
TLC: 5.04 L

## 2022-12-28 MED ORDER — ALBUTEROL SULFATE (2.5 MG/3ML) 0.083% IN NEBU
2.5000 mg | INHALATION_SOLUTION | Freq: Once | RESPIRATORY_TRACT | Status: AC
  Administered 2022-12-28: 2.5 mg via RESPIRATORY_TRACT

## 2023-01-09 ENCOUNTER — Other Ambulatory Visit: Payer: Self-pay | Admitting: Cardiology

## 2023-01-09 DIAGNOSIS — I480 Paroxysmal atrial fibrillation: Secondary | ICD-10-CM

## 2023-01-10 NOTE — Pre-Procedure Instructions (Signed)
Instructed patient on following items: Arrival time 1000 NPO after MN... Bp and anticoagulation meds ok with sips of water AM of procedure Responsible party to stay home after procedure and for 24hrs Have you missed any does of anticoagulant? Pt states that she has not missed any does of Eliquis, Instructed to take scheduled does prior to procedure.

## 2023-01-11 ENCOUNTER — Encounter (HOSPITAL_COMMUNITY)
Admission: RE | Disposition: A | Discharge status: Home / Self Care | Payer: Self-pay | Source: Home / Self Care | Attending: Cardiology

## 2023-01-11 ENCOUNTER — Ambulatory Visit (HOSPITAL_COMMUNITY)
Admission: RE | Admit: 2023-01-11 | Discharge: 2023-01-11 | Disposition: A | Discharge status: Home / Self Care | Payer: Medicare PPO | Attending: Cardiology | Admitting: Cardiology

## 2023-01-11 ENCOUNTER — Other Ambulatory Visit: Payer: Self-pay

## 2023-01-11 ENCOUNTER — Ambulatory Visit (HOSPITAL_COMMUNITY): Payer: Medicare PPO | Admitting: Anesthesiology

## 2023-01-11 ENCOUNTER — Ambulatory Visit (HOSPITAL_BASED_OUTPATIENT_CLINIC_OR_DEPARTMENT_OTHER)
Admission: RE | Admit: 2023-01-11 | Discharge: 2023-01-11 | Disposition: A | Discharge status: Home / Self Care | Payer: Medicare PPO | Source: Ambulatory Visit | Attending: Cardiology | Admitting: Cardiology

## 2023-01-11 ENCOUNTER — Ambulatory Visit (HOSPITAL_BASED_OUTPATIENT_CLINIC_OR_DEPARTMENT_OTHER): Payer: Medicare PPO | Admitting: Anesthesiology

## 2023-01-11 ENCOUNTER — Encounter (HOSPITAL_COMMUNITY): Payer: Self-pay | Admitting: Cardiology

## 2023-01-11 DIAGNOSIS — J449 Chronic obstructive pulmonary disease, unspecified: Secondary | ICD-10-CM

## 2023-01-11 DIAGNOSIS — I110 Hypertensive heart disease with heart failure: Secondary | ICD-10-CM | POA: Diagnosis not present

## 2023-01-11 DIAGNOSIS — I10 Essential (primary) hypertension: Secondary | ICD-10-CM | POA: Diagnosis not present

## 2023-01-11 DIAGNOSIS — I4891 Unspecified atrial fibrillation: Secondary | ICD-10-CM | POA: Insufficient documentation

## 2023-01-11 DIAGNOSIS — Z87891 Personal history of nicotine dependence: Secondary | ICD-10-CM

## 2023-01-11 DIAGNOSIS — E039 Hypothyroidism, unspecified: Secondary | ICD-10-CM | POA: Diagnosis not present

## 2023-01-11 DIAGNOSIS — F32A Depression, unspecified: Secondary | ICD-10-CM | POA: Diagnosis not present

## 2023-01-11 DIAGNOSIS — I5022 Chronic systolic (congestive) heart failure: Secondary | ICD-10-CM | POA: Diagnosis not present

## 2023-01-11 DIAGNOSIS — Z79899 Other long term (current) drug therapy: Secondary | ICD-10-CM | POA: Insufficient documentation

## 2023-01-11 DIAGNOSIS — I482 Chronic atrial fibrillation, unspecified: Secondary | ICD-10-CM

## 2023-01-11 DIAGNOSIS — I480 Paroxysmal atrial fibrillation: Secondary | ICD-10-CM

## 2023-01-11 HISTORY — PX: TEE WITHOUT CARDIOVERSION: SHX5443

## 2023-01-11 HISTORY — PX: CARDIOVERSION: SHX1299

## 2023-01-11 LAB — POCT I-STAT, CHEM 8
BUN: 21 mg/dL (ref 8–23)
Calcium, Ion: 1.23 mmol/L (ref 1.15–1.40)
Chloride: 99 mmol/L (ref 98–111)
Creatinine, Ser: 1.1 mg/dL — ABNORMAL HIGH (ref 0.44–1.00)
Glucose, Bld: 103 mg/dL — ABNORMAL HIGH (ref 70–99)
HCT: 46 % (ref 36.0–46.0)
Hemoglobin: 15.6 g/dL — ABNORMAL HIGH (ref 12.0–15.0)
Potassium: 6.7 mmol/L (ref 3.5–5.1)
Sodium: 137 mmol/L (ref 135–145)
TCO2: 31 mmol/L (ref 22–32)

## 2023-01-11 IMAGING — MR MR CERVICAL SPINE WO/W CM
5 of 8 series · 28 of 48 positions shown · IV contrast (gadavist)
Comparison: Prior radiograph from 10/14/2018.

CLINICAL DATA: Initial evaluation for intermittent neck pain
without radiation for 2 years.

EXAM:
MRI CERVICAL SPINE WITHOUT AND WITH CONTRAST
TECHNIQUE: Multiplanar and multiecho pulse sequences of the cervical spine, to
include the craniocervical junction and cervicothoracic junction,
were obtained without and with intravenous contrast.
CONTRAST:  7.5mL GADAVIST GADOBUTROL 1 MMOL/ML IV SOLN

[Series 5: T2 · sagittal · 3.0mm · 0.62mm/px · 4 of 15 slices shown (1 of 2)]
[im 1/15]
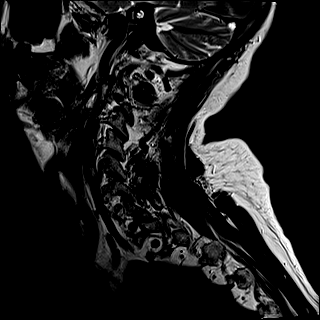
[im 5/15]
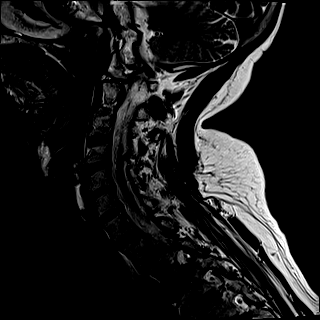
[im 10/15]
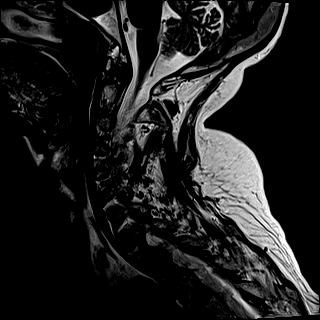
[im 15/15]
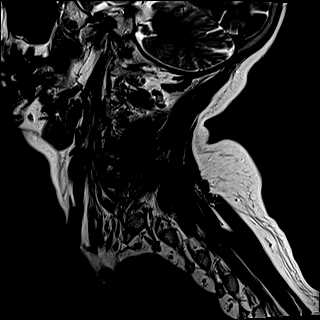

[Series 7: STIR · sagittal · 3.0mm · 0.62mm/px · 4 of 15 slices shown]
[im 1/15]
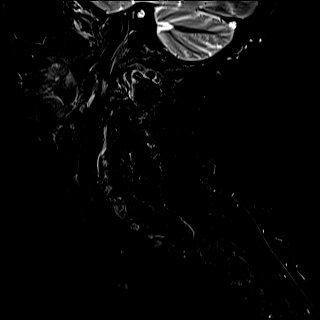
[im 5/15]
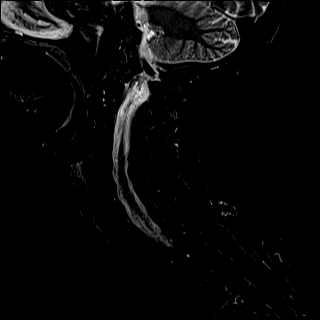
[im 10/15]
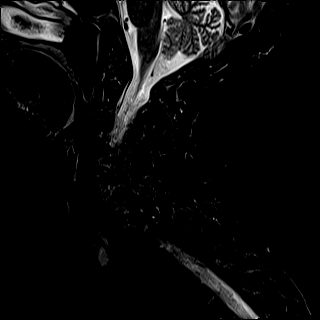
[im 15/15]
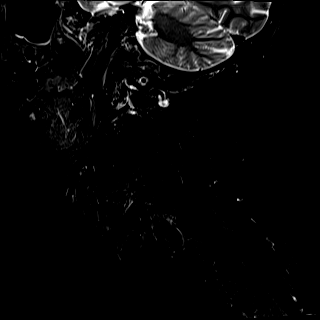

[Series 8: T2 · axial · 3.0mm · 0.70mm/px · z∈[-114,-19]mm · 8 of 29 slices shown (2 of 2)]
[im 1/29]
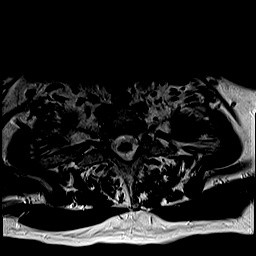
[im 5/29]
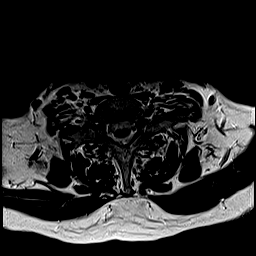
[im 9/29]
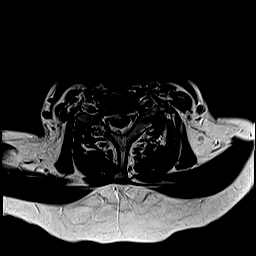
[im 13/29]
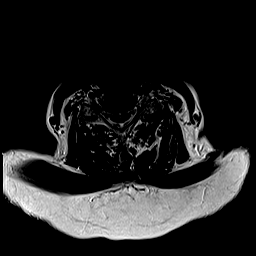
[im 17/29]
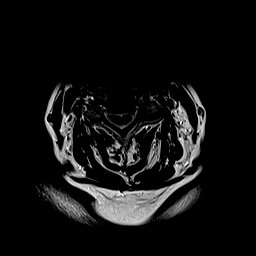
[im 21/29]
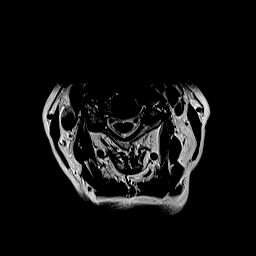
[im 25/29]
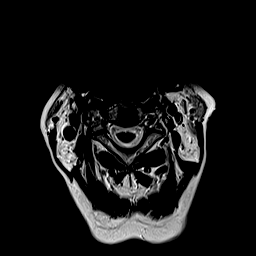
[im 29/29]
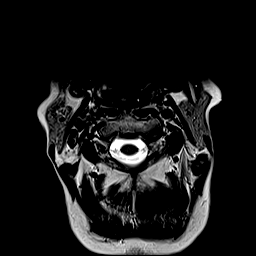

[Series 10: T1 · axial · non-contrast · 3.0mm · 0.35mm/px · z∈[-114,-19]mm · 8 of 29 slices shown]
[im 1/29]
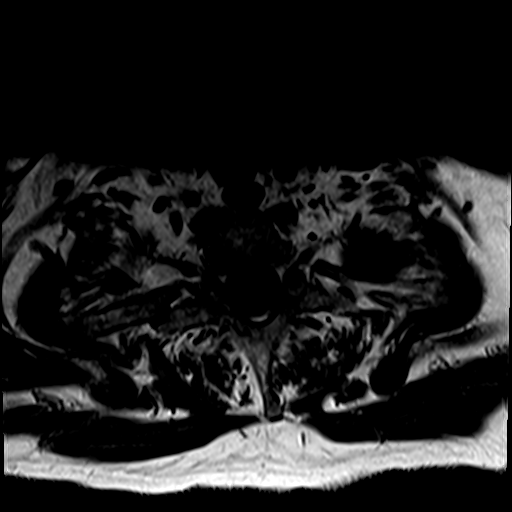
[im 5/29]
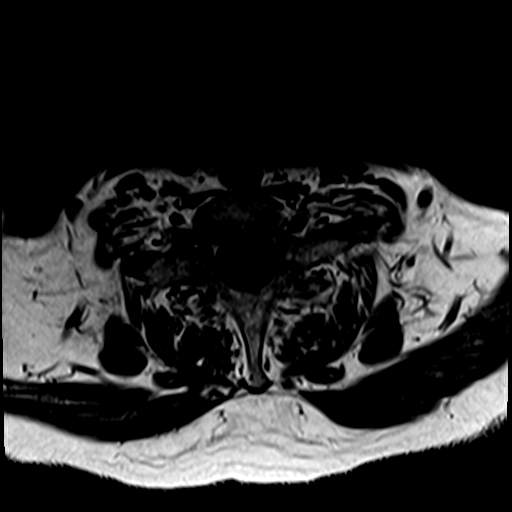
[im 9/29]
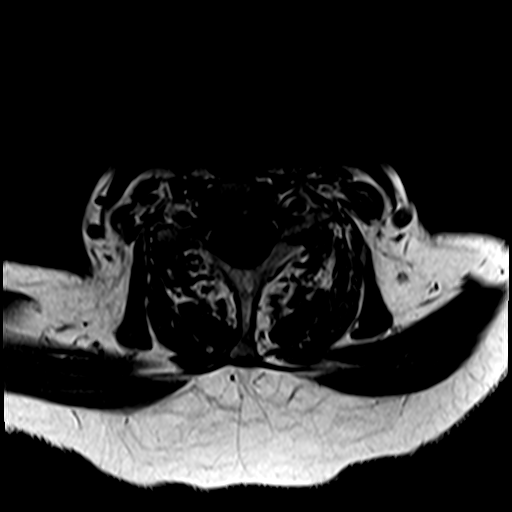
[im 13/29]
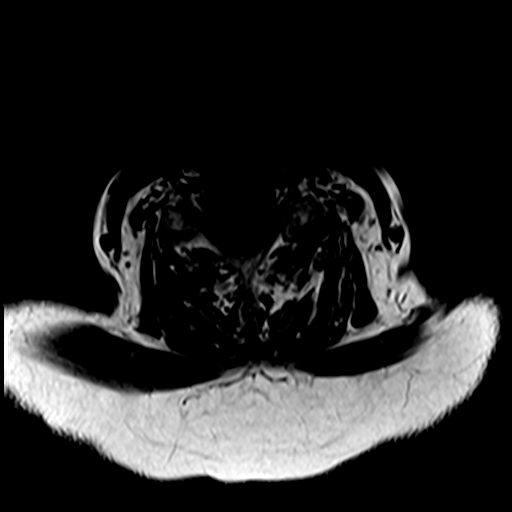
[im 17/29]
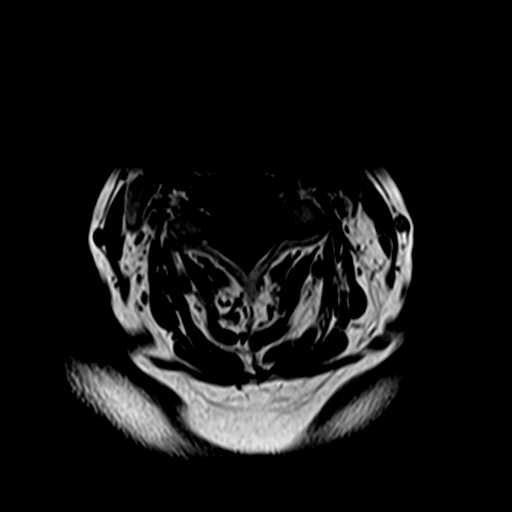
[im 21/29]
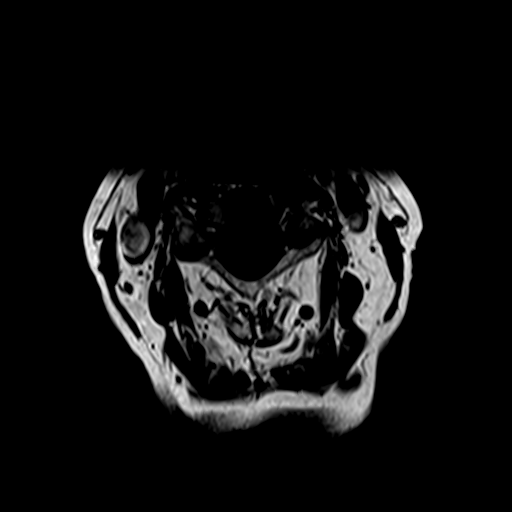
[im 25/29]
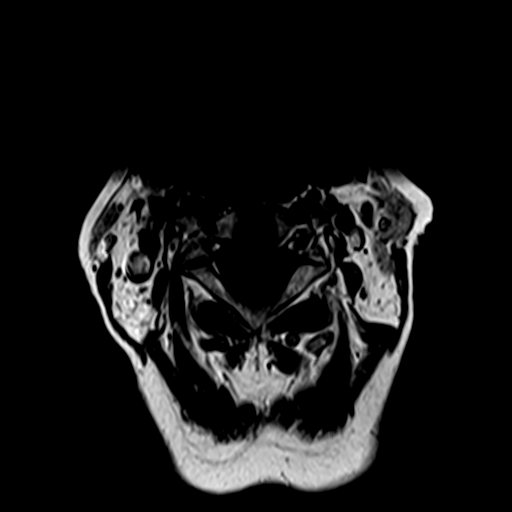
[im 29/29]
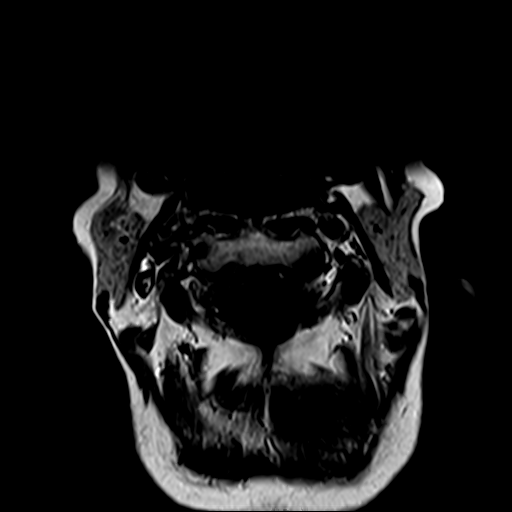

[Series 12: T1 post-contrast · axial · 3.0mm · 0.35mm/px · z∈[-114,-73]mm · 4 of 29 slices shown]
[im 1/29]
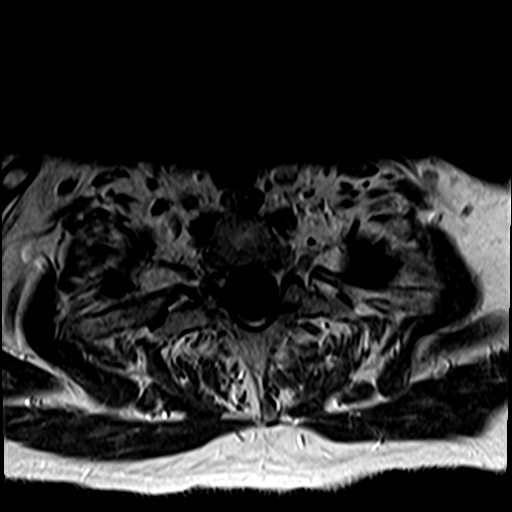
[im 5/29]
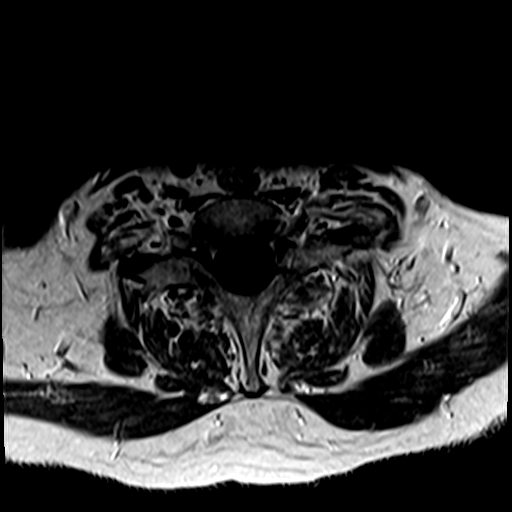
[im 9/29]
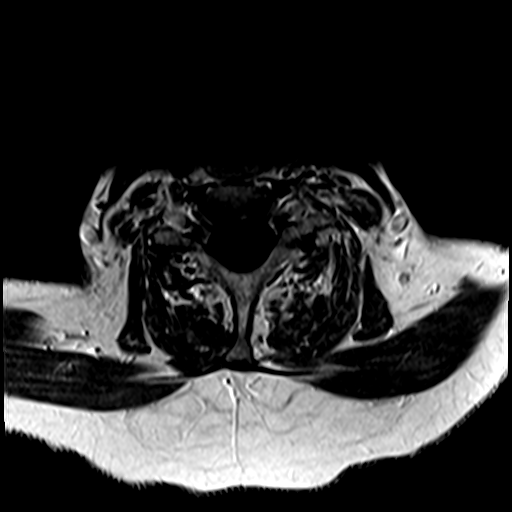
[im 13/29]
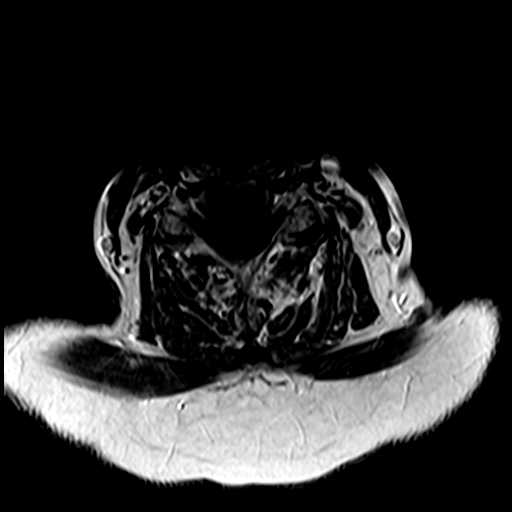

[28 of 48 positions shown; findings below may reference images not displayed]

FINDINGS: Alignment: Exaggeration of the normal cervical lordosis. Trace
retrolisthesis of C4 on C5, with trace anterolisthesis of C7 on T1.

Vertebrae: Vertebral body height maintained without acute or chronic
fracture. Bone marrow signal intensity within normal limits. No
discrete or worrisome osseous lesions. No abnormal marrow edema or
enhancement.

Cord: Normal signal and morphology.  No abnormal enhancement.

Posterior Fossa, vertebral arteries, paraspinal tissues: Visualized
brain and posterior fossa within normal limits. Craniocervical
junction normal. Paraspinous and prevertebral soft tissues within
normal limits. Normal intravascular flow voids seen within the
vertebral arteries bilaterally.

Disc levels:

C2-C3: Unremarkable.

C3-C4:  Unremarkable.

C4-C5: Mild disc bulge with uncovertebral spurring. Mild left
greater than right facet hypertrophy. No spinal stenosis. Moderate
left with mild right C5 foraminal narrowing.

C5-C6: Degenerative intervertebral disc space narrowing with diffuse
disc bulge and bilateral uncovertebral spurring. Mild flattening of
the ventral thecal sac without significant spinal stenosis.
Superimposed mild left greater than right facet hypertrophy. Mild
bilateral C6 foraminal narrowing.

C6-C7: Mild disc bulge with endplate spurring. No spinal stenosis.
Foramina remain patent.

C7-T1: Trace anterolisthesis. No significant disc bulge. Mild left
greater than right facet hypertrophy. No spinal stenosis. Foramina
remain patent.

Visualized upper thoracic spine demonstrates no significant finding.
IMPRESSION: 1. Mild for age multilevel cervical spondylosis without significant
spinal stenosis. Associated mild to moderate bilateral C5 and C6
foraminal narrowing as above.
2. Normal MRI appearance of the cervical spinal cord. No abnormal
enhancement.

## 2023-01-11 SURGERY — ECHOCARDIOGRAM, TRANSESOPHAGEAL
Anesthesia: Monitor Anesthesia Care

## 2023-01-11 MED ORDER — PHENYLEPHRINE 80 MCG/ML (10ML) SYRINGE FOR IV PUSH (FOR BLOOD PRESSURE SUPPORT)
PREFILLED_SYRINGE | INTRAVENOUS | Status: DC | PRN
  Administered 2023-01-11: 80 ug via INTRAVENOUS

## 2023-01-11 MED ORDER — SODIUM CHLORIDE 0.9 % IV SOLN: INTRAVENOUS | Status: DC | PRN

## 2023-01-11 MED ORDER — PROPOFOL 10 MG/ML IV BOLUS
INTRAVENOUS | Status: DC | PRN
  Administered 2023-01-11 (×2): 25 mg via INTRAVENOUS

## 2023-01-11 MED ORDER — PROPOFOL 500 MG/50ML IV EMUL
INTRAVENOUS | Status: DC | PRN
  Administered 2023-01-11: 125 ug/kg/min via INTRAVENOUS

## 2023-01-11 MED ORDER — LIDOCAINE 2% (20 MG/ML) 5 ML SYRINGE
INTRAMUSCULAR | Status: DC | PRN
  Administered 2023-01-11: 40 mg via INTRAVENOUS
  Administered 2023-01-11: 20 mg via INTRAVENOUS

## 2023-01-11 MED ORDER — SODIUM CHLORIDE 0.9 % IV SOLN: INTRAVENOUS | Status: DC

## 2023-01-11 MED ORDER — METOPROLOL TARTRATE 50 MG PO TABS: 50.0000 mg | ORAL_TABLET | Freq: Two times a day (BID) | ORAL | 2 refills | Status: DC

## 2023-01-11 SURGICAL SUPPLY — 1 items: ELECT DEFIB PAD ADLT CADENCE (PAD) ×1 IMPLANT

## 2023-01-11 NOTE — Transfer of Care (Signed)
Immediate Anesthesia Transfer of Care Note  Patient: Christina Carr  Procedure(s) Performed: TRANSESOPHAGEAL ECHOCARDIOGRAM CARDIOVERSION  Patient Location: Cath Lab  Anesthesia Type:MAC  Level of Consciousness: awake and patient cooperative  Airway & Oxygen Therapy: Patient Spontanous Breathing and Patient connected to nasal cannula oxygen  Post-op Assessment: Report given to RN and Post -op Vital signs reviewed and stable  Post vital signs: Reviewed and stable  Last Vitals:  Vitals Value Taken Time  BP    Temp    Pulse    Resp    SpO2      Last Pain: There were no vitals filed for this visit.       Complications: No notable events documented.

## 2023-01-11 NOTE — Anesthesia Postprocedure Evaluation (Signed)
Anesthesia Post Note  Patient: Christina Carr  Procedure(s) Performed: TRANSESOPHAGEAL ECHOCARDIOGRAM CARDIOVERSION     Patient location during evaluation: Cath Lab Anesthesia Type: MAC Level of consciousness: awake and alert, patient cooperative and oriented Pain management: pain level controlled Vital Signs Assessment: post-procedure vital signs reviewed and stable Respiratory status: spontaneous breathing, nonlabored ventilation and respiratory function stable Cardiovascular status: stable and blood pressure returned to baseline Postop Assessment: no apparent nausea or vomiting Anesthetic complications: no   No notable events documented.  Last Vitals:  Vitals:   01/11/23 1120 01/11/23 1130  BP: (!) 134/90 (!) 161/93  Pulse: 61 62  Resp: 18 15  Temp:    SpO2: 98% 98%    Last Pain:  Vitals:   01/11/23 1130  TempSrc:   PainSc: 4                  Eupha Lobb,E. Ayaana Biondo

## 2023-01-11 NOTE — CV Procedure (Signed)
   TRANSESOPHAGEAL ECHOCARDIOGRAM GUIDED DIRECT CURRENT CARDIOVERSION  NAME:  Christina Carr    MRN: [id number] DOB:  November 11, 1945    ADMIT DATE: 01/11/2023  INDICATIONS: Symptomatic atrial fibrillation  PROCEDURE:   Informed consent was obtained prior to the procedure. The risks, benefits and alternatives for the procedure were discussed and the patient comprehended these risks.  Risks include, but are not limited to, cough, sore throat, vomiting, nausea, somnolence, esophageal and stomach trauma or perforation, bleeding, low blood pressure, aspiration, pneumonia, infection, trauma to the teeth and death.    After a procedural time-out, the oropharynx was anesthetized and the patient was sedated by the anesthesia service. The transesophageal probe was inserted in the esophagus and stomach without difficulty and multiple views were obtained. Sedation by anesthesia.   COMPLICATIONS:    Complications: No complications Patient tolerated procedure well.  KEY FINDINGS:  Mildly reduced LV function Normal RV function Trace MR/TR/PI No LAA thrombus.  Full Report to follow.   CARDIOVERSION:     Indications:  Symptomatic Atrial Fibrillation  Procedure Details:  Once the TEE was complete, the patient had the defibrillator pads placed in the anterior and posterior position. Once an appropriate level of sedation was confirmed, the patient was cardioverted x 1 with 200J of biphasic synchronized energy.  The patient converted to NSR.  There were no apparent complications.  The patient had normal neuro status and respiratory status post procedure with vitals stable as recorded elsewhere.  Adequate airway was maintained throughout and vital signs monitored per protocol.  Nusaiba Guallpa Advanced Heart Failure 11:52 AM

## 2023-01-11 NOTE — Discharge Instructions (Signed)
TEE  YOU HAD AN CARDIAC PROCEDURE TODAY: Refer to the procedure report and other information in the discharge instructions given to you for any specific questions about what was found during the examination. If this information does not answer your questions, please call [facility] office at [phone] to clarify.   DIET: Your first meal following the procedure should be a light meal and then it is ok to progress to your normal diet. A half-sandwich or bowl of soup is an example of a good first meal. Heavy or fried foods are harder to digest and may make you feel nauseous or bloated. Drink plenty of fluids but you should avoid alcoholic beverages for 24 hours. If you had a esophageal dilation, please see attached instructions for diet.   ACTIVITY: Your care partner should take you home directly after the procedure. You should plan to take it easy, moving slowly for the rest of the day. You can resume normal activity the day after the procedure however YOU SHOULD NOT DRIVE, use power tools, machinery or perform tasks that involve climbing or major physical exertion for 24 hours (because of the sedation medicines used during the test).   SYMPTOMS TO REPORT IMMEDIATELY: A cardiologist can be reached at any hour. Please call [phone] for any of the following symptoms:  Vomiting of blood or coffee ground material  New, significant abdominal pain  New, significant chest pain or pain under the shoulder blades  Painful or persistently difficult swallowing  New shortness of breath  Black, tarry-looking or red, bloody stools  FOLLOW UP:  Please also call with any specific questions about appointments or follow up tests. Electrical Cardioversion Electrical cardioversion is the delivery of a jolt of electricity to restore a normal rhythm to the heart. A rhythm that is too fast or is not regular keeps the heart from pumping well. In this procedure, sticky patches or metal paddles are placed on the  chest to deliver electricity to the heart from a device. This procedure may be done in an emergency if: There is low or no blood pressure as a result of the heart rhythm. Normal rhythm must be restored as fast as possible to protect the brain and heart from further damage. It may save a life. This may also be a scheduled procedure for irregular or fast heart rhythms that are not immediately life-threatening.  What can I expect after the procedure? Your blood pressure, heart rate, breathing rate, and blood oxygen level will be monitored until you leave the hospital or clinic. Your heart rhythm will be watched to make sure it does not change. You may have some redness on the skin where the shocks were given. Over the counter cortizone cream may be helpful.  Follow these instructions at home: Do not drive for 24 hours if you were given a sedative during your procedure. Take over-the-counter and prescription medicines only as told by your health care provider. Ask your health care provider how to check your pulse. Check it often. Rest for 48 hours after the procedure or as told by your health care provider. Avoid or limit your caffeine use as told by your health care provider. Keep all follow-up visits as told by your health care provider. This is important. Contact a health care provider if: You feel like your heart is beating too quickly or your pulse is not regular. You have a serious muscle cramp that does not go away. Get help right away if: You have discomfort in your   chest. You are dizzy or you feel faint. You have trouble breathing or you are short of breath. Your speech is slurred. You have trouble moving an arm or leg on one side of your body. Your fingers or toes turn cold or blue. Summary Electrical cardioversion is the delivery of a jolt of electricity to restore a normal rhythm to the heart. This procedure may be done right away in an emergency or may be a scheduled procedure  if the condition is not an emergency. Generally, this is a safe procedure. After the procedure, check your pulse often as told by your health care provider. This information is not intended to replace advice given to you by your health care provider. Make sure you discuss any questions you have with your health care provider. Document Revised: 04/14/2019 Document Reviewed: 04/14/2019 Elsevier Patient Education  2020 [employer] 

## 2023-01-11 NOTE — Interval H&P Note (Signed)
History and Physical Interval Note:  01/11/2023 10:07 AM  Christina Carr  has presented today for surgery, with the diagnosis of AFIB.  The various methods of treatment have been discussed with the patient and family. After consideration of risks, benefits and other options for treatment, the patient has consented to  Procedure(s): TRANSESOPHAGEAL ECHOCARDIOGRAM (N/A) CARDIOVERSION (N/A) as a surgical intervention.  The patient's history has been reviewed, patient examined, no change in status, stable for surgery.  I have reviewed the patient's chart and labs.  Questions were answered to the patient's satisfaction.     Kao Conry

## 2023-01-11 NOTE — Anesthesia Preprocedure Evaluation (Addendum)
Anesthesia Evaluation  Patient identified by MRN, date of birth, ID band Patient awake    Reviewed: Allergy & Precautions, NPO status , Patient's Chart, lab work & pertinent test results, reviewed documented beta blocker date and time   History of Anesthesia Complications (+) PONV  Airway Mallampati: II  TM Distance: >3 FB Neck ROM: Full    Dental  (+) Caps, Dental Advisory Given   Pulmonary COPD, former smoker   breath sounds clear to auscultation       Cardiovascular hypertension, Pt. on home beta blockers and Pt. on medications (-) angina + dysrhythmias Atrial Fibrillation  Rhythm:Irregular Rate:Normal  '23 ECHO: EF 50-55%, low normal LVF, global hypokinesis, mild LVH, Grade 1 DD, normal RVF, trivial MR   Neuro/Psych   Anxiety Depression    negative neurological ROS     GI/Hepatic negative GI ROS, Neg liver ROS,,,  Endo/Other  Hypothyroidism    Renal/GU negative Renal ROS     Musculoskeletal   Abdominal   Peds  Hematology eliquis   Anesthesia Other Findings   Reproductive/Obstetrics                             Anesthesia Physical Anesthesia Plan  ASA: 3  Anesthesia Plan: MAC   Post-op Pain Management: Minimal or no pain anticipated   Induction:   PONV Risk Score and Plan: 3 and Treatment may vary due to age or medical condition  Airway Management Planned: Natural Airway and Nasal Cannula  Additional Equipment: None  Intra-op Plan:   Post-operative Plan:   Informed Consent: I have reviewed the patients History and Physical, chart, labs and discussed the procedure including the risks, benefits and alternatives for the proposed anesthesia with the patient or authorized representative who has indicated his/her understanding and acceptance.     Dental advisory given  Plan Discussed with: CRNA and Surgeon  Anesthesia Plan Comments:         Anesthesia Quick  Evaluation

## 2023-01-11 NOTE — Anesthesia Procedure Notes (Signed)
Procedure Name: MAC Date/Time: 01/11/2023 10:28 AM  Performed by: Adria Dill, CRNAPre-anesthesia Checklist: Patient identified, Emergency Drugs available, Suction available and Patient being monitored Patient Re-evaluated:Patient Re-evaluated prior to induction Oxygen Delivery Method: Nasal cannula Preoxygenation: Pre-oxygenation with 100% oxygen Induction Type: IV induction Airway Equipment and Method: Bite block Placement Confirmation: positive ETCO2 and breath sounds checked- equal and bilateral Dental Injury: Teeth and Oropharynx as per pre-operative assessment

## 2023-01-12 ENCOUNTER — Encounter: Payer: Self-pay | Admitting: Cardiology

## 2023-01-13 ENCOUNTER — Encounter: Payer: Self-pay | Admitting: Cardiology

## 2023-01-22 ENCOUNTER — Other Ambulatory Visit
Admission: RE | Admit: 2023-01-22 | Discharge: 2023-01-22 | Disposition: A | Discharge status: Home / Self Care | Payer: Medicare PPO | Source: Ambulatory Visit | Attending: Cardiology | Admitting: Cardiology

## 2023-01-22 ENCOUNTER — Encounter: Payer: Self-pay | Admitting: Cardiology

## 2023-01-22 ENCOUNTER — Ambulatory Visit (HOSPITAL_BASED_OUTPATIENT_CLINIC_OR_DEPARTMENT_OTHER): Payer: Medicare PPO | Admitting: Cardiology

## 2023-01-22 VITALS — BP 119/85 | HR 95 | Resp 14 | Wt 133.1 lb

## 2023-01-22 DIAGNOSIS — I4819 Other persistent atrial fibrillation: Secondary | ICD-10-CM | POA: Diagnosis not present

## 2023-01-22 DIAGNOSIS — I5022 Chronic systolic (congestive) heart failure: Secondary | ICD-10-CM | POA: Diagnosis not present

## 2023-01-22 LAB — COMPREHENSIVE METABOLIC PANEL
ALT: 27 U/L (ref 0–44)
AST: 32 U/L (ref 15–41)
Albumin: 3.9 g/dL (ref 3.5–5.0)
Alkaline Phosphatase: 66 U/L (ref 38–126)
Anion gap: 7 (ref 5–15)
BUN: 25 mg/dL — ABNORMAL HIGH (ref 8–23)
CO2: 27 mmol/L (ref 22–32)
Calcium: 9.5 mg/dL (ref 8.9–10.3)
Chloride: 102 mmol/L (ref 98–111)
Creatinine, Ser: 0.93 mg/dL (ref 0.44–1.00)
GFR, Estimated: >60 mL/min (ref >60)
Glucose, Bld: 103 mg/dL — ABNORMAL HIGH (ref 70–99)
Potassium: 4 mmol/L (ref 3.5–5.1)
Sodium: 136 mmol/L (ref 135–145)
Total Bilirubin: 0.4 mg/dL (ref 0.3–1.2)
Total Protein: 7.3 g/dL (ref 6.5–8.1)

## 2023-01-22 LAB — PROTIME-INR
INR: 1.3 — ABNORMAL HIGH (ref 0.8–1.2)
Prothrombin Time: 15.9 seconds — ABNORMAL HIGH (ref 11.4–15.2)

## 2023-01-22 LAB — TSH: TSH: 2.731 u[IU]/mL (ref 0.350–4.500)

## 2023-01-22 LAB — CBC
HCT: 46.4 % — ABNORMAL HIGH (ref 36.0–46.0)
Hemoglobin: 15.4 g/dL — ABNORMAL HIGH (ref 12.0–15.0)
MCH: 30.4 pg (ref 26.0–34.0)
MCHC: 33.2 g/dL (ref 30.0–36.0)
MCV: 91.5 fL (ref 80.0–100.0)
Platelets: 257 10*3/uL (ref 150–400)
RBC: 5.07 MIL/uL (ref 3.87–5.11)
RDW: 12.5 % (ref 11.5–15.5)
WBC: 9 10*3/uL (ref 4.0–10.5)
nRBC: 0 % (ref 0.0–0.2)

## 2023-01-22 LAB — ECHO TEE

## 2023-01-22 LAB — BRAIN NATRIURETIC PEPTIDE: B Natriuretic Peptide: 186.2 pg/mL — ABNORMAL HIGH (ref 0.0–100.0)

## 2023-01-22 MED ORDER — SPIRONOLACTONE 25 MG PO TABS: 12.5000 mg | ORAL_TABLET | Freq: Every day | ORAL | 5 refills | Status: DC

## 2023-01-22 MED ORDER — AMIODARONE HCL 200 MG PO TABS: 200.0000 mg | ORAL_TABLET | Freq: Two times a day (BID) | ORAL | 3 refills | Status: DC

## 2023-01-22 MED ORDER — LOSARTAN POTASSIUM 25 MG PO TABS: 12.5000 mg | ORAL_TABLET | Freq: Every day | ORAL | 5 refills | Status: DC
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
## 2023-01-22 NOTE — Patient Instructions (Addendum)
Medication Changes:  Start Spironolactone 12.5 mg (0.5 tablet) take daily.  Start Losartan 12.5 mg (0.5 tablet) take daily.  Start Amiodarone 400 mg (2 tablet) take 2 times a day for 7 days only (until Monday Jan 29 2023).  THEN  start taking Amiodarone 200 mg (1 tablet)  take 2 times a day.  Stop  taking Diltiazem.   Lab Work:  You will have lab work today. Labs done today, your results will be available in MyChart, we will contact you for abnormal readings.   Testing/Procedures:  Your physician has recommended that you have a Cardioversion (DCCV). Electrical Cardioversion uses a jolt of electricity to your heart either through paddles or wired patches attached to your chest. This is a controlled, usually prescheduled, procedure. Defibrillation is done under light anesthesia in the hospital, and you usually go home the day of the procedure. This is done to get your heart back into a normal rhythm. You are not awake for the procedure. Please see the instruction sheet given to you today.   We will call you once your cardioversion is schedule.  Do not hold your eliquis the morning of your cardioversion. Hold your fluid pill the morning of your procedure. Do not eat or drink anything the midnight prior to your cardioversion.  Special Instructions // Education:  Do the following things EVERYDAY: Weigh yourself in the morning before breakfast. Write it down and keep it in a log. Take your medicines as prescribed Eat low salt foods--Limit salt (sodium) to 2000 mg per day.  Stay as active as you can everyday Limit all fluids for the day to less than 2 liters   Follow-Up in: You have a follow up in 3 months with Dr. Gasper Lloyd. Please call our clinic in June to schedule your July appointment.     If you have any questions or concerns before your next appointment please send [place] a message through [place] or call our office at [phone] Monday-Friday 8 am-5 pm.   If you have an urgent  need after hours on the weekend please call your Primary Cardiologist or the Advanced Heart Failure Clinic in [place] at [phone].

## 2023-01-24 ENCOUNTER — Encounter: Payer: Medicare PPO | Admitting: Cardiology

## 2023-02-09 NOTE — Addendum Note (Signed)
Encounter addended by: Linnell Fulling on: 02/09/2023 12:01 PM  Actions taken: Imaging Exam ended

## 2023-02-12 ENCOUNTER — Ambulatory Visit
Admission: RE | Admit: 2023-02-12 | Discharge: 2023-02-12 | Disposition: A | Discharge status: Home / Self Care | Payer: Medicare PPO | Attending: Cardiology | Admitting: Cardiology

## 2023-02-12 ENCOUNTER — Encounter
Admission: RE | Disposition: A | Discharge status: Home / Self Care | Payer: Self-pay | Source: Home / Self Care | Attending: Cardiology

## 2023-02-12 ENCOUNTER — Encounter: Payer: Self-pay | Admitting: Cardiology

## 2023-02-12 ENCOUNTER — Ambulatory Visit: Payer: Medicare PPO | Admitting: Certified Registered Nurse Anesthetist

## 2023-02-12 ENCOUNTER — Other Ambulatory Visit: Payer: Self-pay

## 2023-02-12 DIAGNOSIS — I4819 Other persistent atrial fibrillation: Secondary | ICD-10-CM

## 2023-02-12 DIAGNOSIS — I4891 Unspecified atrial fibrillation: Secondary | ICD-10-CM | POA: Insufficient documentation

## 2023-02-12 DIAGNOSIS — I509 Heart failure, unspecified: Secondary | ICD-10-CM | POA: Insufficient documentation

## 2023-02-12 DIAGNOSIS — Z7901 Long term (current) use of anticoagulants: Secondary | ICD-10-CM | POA: Diagnosis not present

## 2023-02-12 DIAGNOSIS — Z90710 Acquired absence of both cervix and uterus: Secondary | ICD-10-CM | POA: Diagnosis not present

## 2023-02-12 DIAGNOSIS — F32A Depression, unspecified: Secondary | ICD-10-CM | POA: Insufficient documentation

## 2023-02-12 DIAGNOSIS — Z87891 Personal history of nicotine dependence: Secondary | ICD-10-CM | POA: Diagnosis not present

## 2023-02-12 DIAGNOSIS — Z79899 Other long term (current) drug therapy: Secondary | ICD-10-CM | POA: Diagnosis not present

## 2023-02-12 DIAGNOSIS — Z09 Encounter for follow-up examination after completed treatment for conditions other than malignant neoplasm: Secondary | ICD-10-CM | POA: Diagnosis not present

## 2023-02-12 DIAGNOSIS — I110 Hypertensive heart disease with heart failure: Secondary | ICD-10-CM | POA: Insufficient documentation

## 2023-02-12 DIAGNOSIS — E039 Hypothyroidism, unspecified: Secondary | ICD-10-CM | POA: Insufficient documentation

## 2023-02-12 DIAGNOSIS — J439 Emphysema, unspecified: Secondary | ICD-10-CM | POA: Diagnosis not present

## 2023-02-12 HISTORY — PX: CARDIOVERSION: SHX1299

## 2023-02-12 SURGERY — CARDIOVERSION
Anesthesia: General

## 2023-02-12 MED ORDER — METOPROLOL TARTRATE 50 MG PO TABS: 25.0000 mg | ORAL_TABLET | Freq: Two times a day (BID) | ORAL | 2 refills | Status: DC

## 2023-02-12 MED ORDER — PROPOFOL 10 MG/ML IV BOLUS
INTRAVENOUS | Status: DC | PRN
  Administered 2023-02-12: 30 mg via INTRAVENOUS
  Administered 2023-02-12: 20 mg via INTRAVENOUS

## 2023-02-12 MED ORDER — SODIUM CHLORIDE 0.9 % IV SOLN: INTRAVENOUS | Status: DC

## 2023-02-12 MED ORDER — AMIODARONE HCL 200 MG PO TABS: 200.0000 mg | ORAL_TABLET | Freq: Every day | ORAL | 0 refills | Status: DC

## 2023-02-12 MED ORDER — PROPOFOL 10 MG/ML IV BOLUS
INTRAVENOUS | Status: AC
  Filled 2023-02-12: qty 20

## 2023-02-12 NOTE — Anesthesia Postprocedure Evaluation (Signed)
Anesthesia Post Note  Patient: Christina Carr  Procedure(s) Performed: CARDIOVERSION  Patient location during evaluation: Specials Recovery Anesthesia Type: General Level of consciousness: awake and alert Pain management: pain level controlled Vital Signs Assessment: post-procedure vital signs reviewed and stable Respiratory status: spontaneous breathing, nonlabored ventilation, respiratory function stable and patient connected to nasal cannula oxygen Cardiovascular status: blood pressure returned to baseline and stable Postop Assessment: no apparent nausea or vomiting Anesthetic complications: no   No notable events documented.   Last Vitals:  Vitals:   02/12/23 0815 02/12/23 0830  BP: 126/65 129/72  Pulse: (!) 54 (!) 55  Resp: 19 13  Temp:    SpO2: 99% 100%    Last Pain:  Vitals:   02/12/23 0830  TempSrc:   PainSc: 0-No pain                 Cleda Mccreedy Makila Colombe

## 2023-02-12 NOTE — Anesthesia Preprocedure Evaluation (Signed)
Anesthesia Evaluation  Patient identified by MRN, date of birth, ID band Patient awake    Reviewed: Allergy & Precautions, NPO status , Patient's Chart, lab work & pertinent test results  History of Anesthesia Complications (+) PONV and history of anesthetic complications  Airway Mallampati: III  TM Distance: >3 FB Neck ROM: full    Dental  (+) Chipped   Pulmonary COPD, former smoker   Pulmonary exam normal        Cardiovascular hypertension, + dysrhythmias Atrial Fibrillation  Rhythm:irregular Rate:Normal     Neuro/Psych  PSYCHIATRIC DISORDERS      negative neurological ROS     GI/Hepatic negative GI ROS, Neg liver ROS,,,  Endo/Other  Hypothyroidism    Renal/GU negative Renal ROS  negative genitourinary   Musculoskeletal   Abdominal   Peds  Hematology negative hematology ROS (+)   Anesthesia Other Findings Past Medical History: No date: Anemia No date: Anxiety No date: Arrhythmia     Comment:  atrial fibrillation No date: Arthritis     Comment:  osteoporosis too No date: Cataracts, bilateral No date: Cervical disc herniation No date: CHF (congestive heart failure) (HCC) No date: COPD (chronic obstructive pulmonary disease) (HCC)     Comment:  mild emphysema No date: Depression No date: Goiter No date: Hypercholesteremia No date: Hypertension     Comment:  8/18 Stress and Echo in 04/2017 WNL No date: Hypothyroidism No date: Osteoporosis 12/29/2015: Personal history of tobacco use, presenting hazards to  health No date: PONV (postoperative nausea and vomiting)     Comment:  only with hysterectomy No date: Thyroid disease  Past Surgical History: No date: ABDOMINAL HYSTERECTOMY 1995: AUGMENTATION MAMMAPLASTY; Bilateral     Comment:  silicone 01/11/2023: CARDIOVERSION; N/A     Comment:  Procedure: CARDIOVERSION;  Surgeon: Dorthula Nettles,               DO;  Location: [facility];  Service:                Cardiovascular;  Laterality: N/A; 07/12/2017: CATARACT EXTRACTION W/PHACO; Left     Comment:  Procedure: CATARACT EXTRACTION PHACO AND INTRAOCULAR               LENS PLACEMENT (IOC)-LEFT;  Surgeon: Nevada Crane,               MD;  Location: [facility] OR[facility];  Service: Ophthalmology;                Laterality: Left;  Lot # [id number] H US:00:26.7 AP%:               8.0 CDE: 2.14  08/02/2017: CATARACT EXTRACTION W/PHACO; Right     Comment:  Procedure: CATARACT EXTRACTION PHACO AND INTRAOCULAR               LENS PLACEMENT (IOC)-RIGHT PRE DIABETIC;  Surgeon: Nevada Crane, MD;  Location: [facility] OR[facility];  Service:               Ophthalmology;  Laterality: Right;  [place] 00:19.9 AP%               12.8 CDE 2.55 [employer] lot # [id number] No date: CESAREAN SECTION No date: COLONOSCOPY 04/29/2018: COLONOSCOPY WITH PROPOFOL; N/A     Comment:  Procedure: COLONOSCOPY WITH PROPOFOL;  Surgeon:               Barnetta Chapel  U, MD;  Location: [facility];                Service: Endoscopy;  Laterality: N/A; 06/2017: EYE SURGERY; Left     Comment:  cataract extraction 01/11/2023: TEE WITHOUT CARDIOVERSION; N/A     Comment:  Procedure: TRANSESOPHAGEAL ECHOCARDIOGRAM;  Surgeon:               Dorthula Nettles, DO;  Location: [facility];                Service: Cardiovascular;  Laterality: N/A; No date: TUBAL LIGATION  BMI    Body Mass Index: 22.14 kg/m      Reproductive/Obstetrics negative OB ROS                             Anesthesia Physical Anesthesia Plan  ASA: 3  Anesthesia Plan: General   Post-op Pain Management:    Induction: Intravenous  PONV Risk Score and Plan: Propofol infusion and TIVA  Airway Management Planned: Natural Airway and Nasal Cannula  Additional Equipment:   Intra-op Plan:   Post-operative Plan:   Informed Consent: I have reviewed the patients History and Physical, chart, labs and discussed the  procedure including the risks, benefits and alternatives for the proposed anesthesia with the patient or authorized representative who has indicated his/her understanding and acceptance.     Dental Advisory Given  Plan Discussed with: Anesthesiologist, CRNA and Surgeon  Anesthesia Plan Comments: (Patient consented for risks of anesthesia including but not limited to:  - adverse reactions to medications - risk of airway placement if required - damage to eyes, teeth, lips or other oral mucosa - nerve damage due to positioning  - sore throat or hoarseness - Damage to heart, brain, nerves, lungs, other parts of body or loss of life  Patient voiced understanding.)       Anesthesia Quick Evaluation

## 2023-02-12 NOTE — Anesthesia Procedure Notes (Signed)
Date/Time: 02/12/2023 7:40 AM  Performed by: Ginger Carne, CRNAPre-anesthesia Checklist: Emergency Drugs available, Suction available, Patient being monitored, Timeout performed and Patient identified Patient Re-evaluated:Patient Re-evaluated prior to induction Oxygen Delivery Method: Nasal cannula Preoxygenation: Pre-oxygenation with 100% oxygen Induction Type: IV induction

## 2023-02-12 NOTE — CV Procedure (Signed)
   TRANSESOPHAGEAL ECHOCARDIOGRAM GUIDED DIRECT CURRENT CARDIOVERSION  NAME:  Christina Carr    MRN: [id number] DOB:  04/17/1946    ADMIT DATE: 02/12/2023  INDICATIONS: Symptomatic atrial fibrillation  CARDIOVERSION:     Indications:  Symptomatic Atrial Fibrillation  Procedure Details:  TEE was done at a previous appointment. The patient had the defibrillator pads placed in the anterior and posterior position. Once an appropriate level of sedation was confirmed, the patient was cardioverted x 1 with 200J of biphasic synchronized energy.  The patient converted to NSR.  There were no apparent complications.  The patient had normal neuro status and respiratory status post procedure with vitals stable as recorded elsewhere.  Adequate airway was maintained throughout and vital signs monitored per protocol.  [name] Advanced Heart Failure 7:44 AM

## 2023-02-12 NOTE — Transfer of Care (Signed)
Immediate Anesthesia Transfer of Care Note  Patient: Christina Carr  Procedure(s) Performed: CARDIOVERSION  Patient Location:  cardiac short stay  Anesthesia Type:General  Level of Consciousness: sedated and drowsy  Airway & Oxygen Therapy: Patient Spontanous Breathing and Patient connected to nasal cannula oxygen  Post-op Assessment: Report given to RN and Post -op Vital signs reviewed and stable  Post vital signs: Reviewed and stable  Last Vitals:  Vitals Value Taken Time  BP 140/76 02/12/23 0746  Temp    Pulse 56 02/12/23 0747  Resp 18 02/12/23 0747  SpO2 99 % 02/12/23 0747  Vitals shown include unvalidated device data.  Last Pain:  Vitals:   02/12/23 0659  TempSrc: Oral  PainSc: 0-No pain         Complications: No notable events documented.

## 2023-02-12 NOTE — Interval H&P Note (Signed)
History and Physical Interval Note:  02/12/2023 7:36 AM  Christina Carr  has presented today for surgery, with the diagnosis of Cardioversion   Afib.  The various methods of treatment have been discussed with the patient and family. After consideration of risks, benefits and other options for treatment, the patient has consented to  Procedure(s): CARDIOVERSION (N/A) as a surgical intervention.  The patient's history has been reviewed, patient examined, no change in status, stable for surgery.  I have reviewed the patient's chart and labs.  Questions were answered to the patient's satisfaction.     Caitrin Pendergraph

## 2023-02-13 ENCOUNTER — Encounter: Payer: Self-pay | Admitting: Cardiology

## 2023-02-22 ENCOUNTER — Telehealth: Payer: Self-pay

## 2023-02-23 NOTE — Progress Notes (Signed)
Called and nitified pt of the follpwing: At this time no med changes until we can see her. Can we schedule her in clinic at [facility]? She can see Freida Busman or Jesusita Oka also.  Thanks, Adi  Scheduled pt an appt with Inetta Fermo /hackney March 02, 2023 @ 11:30AM.
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
## 2023-03-02 ENCOUNTER — Encounter: Payer: Self-pay | Admitting: Family

## 2023-03-02 ENCOUNTER — Other Ambulatory Visit
Admission: RE | Admit: 2023-03-02 | Discharge: 2023-03-02 | Disposition: A | Discharge status: Home / Self Care | Payer: Medicare PPO | Source: Ambulatory Visit | Attending: Cardiology | Admitting: Cardiology

## 2023-03-02 ENCOUNTER — Ambulatory Visit (HOSPITAL_BASED_OUTPATIENT_CLINIC_OR_DEPARTMENT_OTHER): Payer: Medicare PPO | Admitting: Family

## 2023-03-02 VITALS — BP 118/77 | HR 50 | Wt 131.0 lb

## 2023-03-02 DIAGNOSIS — I10 Essential (primary) hypertension: Secondary | ICD-10-CM

## 2023-03-02 DIAGNOSIS — J449 Chronic obstructive pulmonary disease, unspecified: Secondary | ICD-10-CM | POA: Diagnosis not present

## 2023-03-02 DIAGNOSIS — I5022 Chronic systolic (congestive) heart failure: Secondary | ICD-10-CM | POA: Insufficient documentation

## 2023-03-02 DIAGNOSIS — I4819 Other persistent atrial fibrillation: Secondary | ICD-10-CM

## 2023-03-02 LAB — BASIC METABOLIC PANEL
Anion gap: 9 (ref 5–15)
BUN: 16 mg/dL (ref 8–23)
CO2: 26 mmol/L (ref 22–32)
Calcium: 9.3 mg/dL (ref 8.9–10.3)
Chloride: 100 mmol/L (ref 98–111)
Creatinine, Ser: 1.09 mg/dL — ABNORMAL HIGH (ref 0.44–1.00)
GFR, Estimated: 53 mL/min — ABNORMAL LOW (ref >60)
Glucose, Bld: 106 mg/dL — ABNORMAL HIGH (ref 70–99)
Potassium: 4.2 mmol/L (ref 3.5–5.1)
Sodium: 135 mmol/L (ref 135–145)

## 2023-03-14 ENCOUNTER — Other Ambulatory Visit
Admission: RE | Admit: 2023-03-14 | Discharge: 2023-03-14 | Disposition: A | Discharge status: Home / Self Care | Payer: Medicare PPO | Source: Ambulatory Visit | Attending: Internal Medicine | Admitting: Internal Medicine

## 2023-03-14 DIAGNOSIS — Z01818 Encounter for other preprocedural examination: Secondary | ICD-10-CM | POA: Diagnosis present

## 2023-03-14 DIAGNOSIS — I5032 Chronic diastolic (congestive) heart failure: Secondary | ICD-10-CM | POA: Insufficient documentation

## 2023-03-14 DIAGNOSIS — I110 Hypertensive heart disease with heart failure: Secondary | ICD-10-CM | POA: Insufficient documentation

## 2023-03-14 DIAGNOSIS — I4819 Other persistent atrial fibrillation: Secondary | ICD-10-CM | POA: Insufficient documentation

## 2023-03-14 LAB — BRAIN NATRIURETIC PEPTIDE: B Natriuretic Peptide: 150.9 pg/mL — ABNORMAL HIGH (ref 0.0–100.0)

## 2023-03-22 NOTE — Anesthesia Preprocedure Evaluation (Addendum)
Anesthesia Evaluation  Patient identified by MRN, date of birth, ID band Patient awake    Reviewed: Allergy & Precautions, NPO status , Patient's Chart, lab work & pertinent test results, reviewed documented beta blocker date and time   History of Anesthesia Complications (+) PONV and history of anesthetic complications  Airway Mallampati: III  TM Distance: >3 FB Neck ROM: full    Dental  (+) Chipped   Pulmonary COPD, former smoker   Pulmonary exam normal        Cardiovascular hypertension, Pt. on home beta blockers and Pt. on medications +CHF (HFrEF)  + dysrhythmias Atrial Fibrillation  Rhythm:irregular Rate:Normal   ECHO 1. Left ventricular ejection fraction, by estimation, is 35 to 40%. The  left ve 1. Left ventricular ejection fraction, by estimation, is 35 to 40%. The  left ventricle has moderately decreased function.   2. Right ventricular systolic function is low normal. The right  ventricular size is normal.   3. Left atrial size was severely dilated. No left atrial/left atrial  appendage thrombus was detected.   4. Right atrial size was mildly dilated.   5. The mitral valve is normal in structure. Trivial mitral valve  regurgitation.   6. The aortic valve is normal in structure. There is mild thickening of  the aortic valve. Aortic valve regurgitation is not visualized. No aortic  stenosis is present.  ntricle has moderately decreased function.   2. Right ventricular systolic function is low normal. The right  ventricular size is normal.   3. Left atrial size was severely dilated. No left atrial/left atrial  appendage thrombus was detected.   4. Right atrial size was mildly dilated.   5. The mitral valve is normal in structure. Trivial mitral valve  regurgitation.   6. The aortic valve is normal in structure. There is mild thickening of  the aortic valve. Aortic valve regurgitation is not visualized. No aortic   stenosis is present.     Neuro/Psych  PSYCHIATRIC DISORDERS Anxiety     negative neurological ROS     GI/Hepatic negative GI ROS, Neg liver ROS,,,  Endo/Other  Hypothyroidism    Renal/GU negative Renal ROS  negative genitourinary   Musculoskeletal   Abdominal   Peds  Hematology negative hematology ROS (+)   Anesthesia Other Findings Past Medical History: No date: Anemia No date: Anxiety No date: Arrhythmia     Comment:  atrial fibrillation No date: Arthritis     Comment:  osteoporosis too No date: Cataracts, bilateral No date: Cervical disc herniation No date: CHF (congestive heart failure) (HCC) No date: COPD (chronic obstructive pulmonary disease) (HCC)     Comment:  mild emphysema No date: Depression No date: Goiter No date: Hypercholesteremia No date: Hypertension     Comment:  8/18 Stress and Echo in 04/2017 WNL No date: Hypothyroidism No date: Osteoporosis 12/29/2015: Personal history of tobacco use, presenting hazards to  health No date: PONV (postoperative nausea and vomiting)     Comment:  only with hysterectomy No date: Thyroid disease  Past Surgical History: No date: ABDOMINAL HYSTERECTOMY 1995: AUGMENTATION MAMMAPLASTY; Bilateral     Comment:  silicone 01/11/2023: CARDIOVERSION; N/A     Comment:  Procedure: CARDIOVERSION;  Surgeon: Dorthula Nettles,               DO;  Location: [facility];  Service:               Cardiovascular;  Laterality: N/A; 07/12/2017: CATARACT EXTRACTION W/PHACO; Left  Comment:  Procedure: CATARACT EXTRACTION PHACO AND INTRAOCULAR               LENS PLACEMENT (IOC)-LEFT;  Surgeon: Nevada Crane,               MD;  Location: [facility];  Service: Ophthalmology;                Laterality: Left;  Lot # [id number] H US:00:26.7 AP%:               8.0 CDE: 2.14  08/02/2017: CATARACT EXTRACTION W/PHACO; Right     Comment:  Procedure: CATARACT EXTRACTION PHACO AND INTRAOCULAR               LENS PLACEMENT  (IOC)-RIGHT PRE DIABETIC;  Surgeon: Nevada Crane, MD;  Location: [facility];  Service:               Ophthalmology;  Laterality: Right;  [place] 00:19.9 AP%               12.8 CDE 2.55 [employer] lot # [id number] No date: CESAREAN SECTION No date: COLONOSCOPY 04/29/2018: COLONOSCOPY WITH PROPOFOL; N/A     Comment:  Procedure: COLONOSCOPY WITH PROPOFOL;  Surgeon:               Christena Deem, MD;  Location: [facility];                Service: Endoscopy;  Laterality: N/A; 06/2017: EYE SURGERY; Left     Comment:  cataract extraction 01/11/2023: TEE WITHOUT CARDIOVERSION; N/A     Comment:  Procedure: TRANSESOPHAGEAL ECHOCARDIOGRAM;  Surgeon:               Dorthula Nettles, DO;  Location: [facility];                Service: Cardiovascular;  Laterality: N/A; No date: TUBAL LIGATION  BMI    Body Mass Index: 22.14 kg/m      Reproductive/Obstetrics negative OB ROS                              Anesthesia Physical Anesthesia Plan  ASA: 3  Anesthesia Plan: General   Post-op Pain Management:    Induction: Intravenous  PONV Risk Score and Plan: Propofol infusion and TIVA  Airway Management Planned: Natural Airway and Nasal Cannula  Additional Equipment:   Intra-op Plan:   Post-operative Plan:   Informed Consent: I have reviewed the patients History and Physical, chart, labs and discussed the procedure including the risks, benefits and alternatives for the proposed anesthesia with the patient or authorized representative who has indicated his/her understanding and acceptance.     Dental Advisory Given  Plan Discussed with: Anesthesiologist, CRNA and Surgeon  Anesthesia Plan Comments:         Anesthesia Quick Evaluation

## 2023-03-23 ENCOUNTER — Other Ambulatory Visit: Payer: Self-pay

## 2023-03-23 ENCOUNTER — Ambulatory Visit
Admission: RE | Admit: 2023-03-23 | Discharge: 2023-03-23 | Disposition: A | Discharge status: Home / Self Care | Payer: Medicare PPO | Attending: Internal Medicine | Admitting: Internal Medicine

## 2023-03-23 ENCOUNTER — Ambulatory Visit: Payer: Medicare PPO | Admitting: Anesthesiology

## 2023-03-23 ENCOUNTER — Encounter
Admission: RE | Disposition: A | Discharge status: Home / Self Care | Payer: Self-pay | Source: Home / Self Care | Attending: Internal Medicine

## 2023-03-23 ENCOUNTER — Encounter: Payer: Self-pay | Admitting: Internal Medicine

## 2023-03-23 DIAGNOSIS — F419 Anxiety disorder, unspecified: Secondary | ICD-10-CM | POA: Insufficient documentation

## 2023-03-23 DIAGNOSIS — F32A Depression, unspecified: Secondary | ICD-10-CM | POA: Insufficient documentation

## 2023-03-23 DIAGNOSIS — I509 Heart failure, unspecified: Secondary | ICD-10-CM | POA: Insufficient documentation

## 2023-03-23 DIAGNOSIS — J439 Emphysema, unspecified: Secondary | ICD-10-CM | POA: Diagnosis not present

## 2023-03-23 DIAGNOSIS — Z87891 Personal history of nicotine dependence: Secondary | ICD-10-CM | POA: Insufficient documentation

## 2023-03-23 DIAGNOSIS — I110 Hypertensive heart disease with heart failure: Secondary | ICD-10-CM | POA: Insufficient documentation

## 2023-03-23 DIAGNOSIS — E039 Hypothyroidism, unspecified: Secondary | ICD-10-CM | POA: Insufficient documentation

## 2023-03-23 DIAGNOSIS — E7800 Pure hypercholesterolemia, unspecified: Secondary | ICD-10-CM | POA: Insufficient documentation

## 2023-03-23 DIAGNOSIS — I4819 Other persistent atrial fibrillation: Secondary | ICD-10-CM | POA: Insufficient documentation

## 2023-03-23 DIAGNOSIS — Z79899 Other long term (current) drug therapy: Secondary | ICD-10-CM | POA: Diagnosis not present

## 2023-03-23 DIAGNOSIS — J449 Chronic obstructive pulmonary disease, unspecified: Secondary | ICD-10-CM | POA: Diagnosis not present

## 2023-03-23 HISTORY — PX: CARDIOVERSION: SHX1299

## 2023-03-23 SURGERY — CARDIOVERSION
Anesthesia: General

## 2023-03-23 MED ORDER — PROPOFOL 10 MG/ML IV BOLUS
INTRAVENOUS | Status: AC
  Filled 2023-03-23: qty 20

## 2023-03-23 MED ORDER — SODIUM CHLORIDE 0.9 % IV SOLN: INTRAVENOUS | Status: DC

## 2023-03-23 MED ORDER — PROPOFOL 10 MG/ML IV BOLUS
INTRAVENOUS | Status: DC | PRN
  Administered 2023-03-23 (×2): 20 mg via INTRAVENOUS

## 2023-03-23 NOTE — Transfer of Care (Signed)
Immediate Anesthesia Transfer of Care Note  Patient: Christina Carr  Procedure(s) Performed: CARDIOVERSION  Patient Location: cardiac specials  Anesthesia Type:General  Level of Consciousness: sedated and drowsy  Airway & Oxygen Therapy: Patient Spontanous Breathing and Patient connected to nasal cannula oxygen  Post-op Assessment: Report given to RN and Post -op Vital signs reviewed and stable  Post vital signs: Reviewed and stable  Last Vitals:  Vitals Value Taken Time  BP 122/68 03/23/23 0749  Temp    Pulse 60 03/23/23 0749  Resp 28 03/23/23 0749  SpO2 100 % 03/23/23 0749  Vitals shown include unvalidated device data.  Last Pain:  Vitals:   03/23/23 0651  TempSrc: Oral  PainSc: 0-No pain         Complications: No notable events documented.

## 2023-03-23 NOTE — CV Procedure (Signed)
Electrical Cardioversion Procedure Note   Procedure: Electrical Cardioversion Indications:  Atrial Fibrillation  Procedure Details Consent: Risks of procedure as well as the alternatives and risks of each were explained to the (patient/caregiver).  Consent for procedure obtained. Time Out: Verified patient identification, verified procedure, site/side was marked, verified correct patient position, special equipment/implants available, medications/allergies/relevent history reviewed, required imaging and test results available.  Performed  Patient placed on cardiac monitor, pulse oximetry, supplemental oxygen as necessary.  Sedation given:  Propofol as per anesthesia Pacer pads placed anterior and posterior chest.  Cardioverted 1 time(s).  Cardioverted at 120J.  Evaluation Findings: Post procedure EKG shows: NSR Complications: None Patient did tolerate procedure well.   Dorothyann Peng MD 03/23/23

## 2023-03-23 NOTE — Anesthesia Procedure Notes (Signed)
Date/Time: 03/23/2023 7:30 AM  Performed by: Ginger Carne, CRNAPre-anesthesia Checklist: Patient identified, Emergency Drugs available, Suction available, Patient being monitored and Timeout performed Patient Re-evaluated:Patient Re-evaluated prior to induction Oxygen Delivery Method: Nasal cannula Preoxygenation: Pre-oxygenation with 100% oxygen Induction Type: IV induction

## 2023-03-27 NOTE — Anesthesia Postprocedure Evaluation (Signed)
Anesthesia Post Note  Patient: Christina Carr  Procedure(s) Performed: CARDIOVERSION  Patient location during evaluation: PACU Anesthesia Type: General Level of consciousness: awake and alert Pain management: pain level controlled Vital Signs Assessment: post-procedure vital signs reviewed and stable Respiratory status: spontaneous breathing, nonlabored ventilation and respiratory function stable Cardiovascular status: blood pressure returned to baseline and stable Postop Assessment: no apparent nausea or vomiting Anesthetic complications: no   No notable events documented.   Last Vitals:  Vitals:   03/23/23 0822 03/23/23 0830  BP: 118/71 108/68  Pulse: 60   Resp: 15 14  Temp:    SpO2: 100% 100%    Last Pain:  Vitals:   03/23/23 0830  TempSrc:   PainSc: 0-No pain                 Foye Deer

## 2023-04-19 ENCOUNTER — Ambulatory Visit (HOSPITAL_BASED_OUTPATIENT_CLINIC_OR_DEPARTMENT_OTHER): Payer: Medicare PPO | Admitting: Cardiology

## 2023-04-19 ENCOUNTER — Other Ambulatory Visit
Admission: RE | Admit: 2023-04-19 | Discharge: 2023-04-19 | Disposition: A | Discharge status: Home / Self Care | Payer: Medicare PPO | Source: Ambulatory Visit | Attending: Cardiology | Admitting: Cardiology

## 2023-04-19 ENCOUNTER — Encounter: Payer: Medicare PPO | Admitting: Cardiology

## 2023-04-19 VITALS — BP 128/76 | HR 77 | Ht 64.0 in | Wt 133.0 lb

## 2023-04-19 DIAGNOSIS — I5022 Chronic systolic (congestive) heart failure: Secondary | ICD-10-CM

## 2023-04-19 DIAGNOSIS — I4819 Other persistent atrial fibrillation: Secondary | ICD-10-CM

## 2023-04-19 LAB — BASIC METABOLIC PANEL
Anion gap: 7 (ref 5–15)
BUN: 23 mg/dL (ref 8–23)
CO2: 27 mmol/L (ref 22–32)
Calcium: 9.4 mg/dL (ref 8.9–10.3)
Chloride: 104 mmol/L (ref 98–111)
Creatinine, Ser: 1.17 mg/dL — ABNORMAL HIGH (ref 0.44–1.00)
GFR, Estimated: 48 mL/min — ABNORMAL LOW (ref >60)
Glucose, Bld: 92 mg/dL (ref 70–99)
Potassium: 4 mmol/L (ref 3.5–5.1)
Sodium: 138 mmol/L (ref 135–145)

## 2023-04-19 LAB — BRAIN NATRIURETIC PEPTIDE: B Natriuretic Peptide: 175.5 pg/mL — ABNORMAL HIGH (ref 0.0–100.0)

## 2023-04-19 MED ORDER — METOPROLOL SUCCINATE ER 25 MG PO TB24: 25.0000 mg | ORAL_TABLET | Freq: Every day | ORAL | 3 refills | Status: DC

## 2023-04-19 MED ORDER — LOSARTAN POTASSIUM 25 MG PO TABS: 25.0000 mg | ORAL_TABLET | Freq: Every day | ORAL | 3 refills | Status: DC
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
## 2023-04-19 NOTE — Patient Instructions (Signed)
Medication Changes:  Increase Losartan 25 mg (1 tablet) take at night.  Start Metoprolol Succinate 25 mg (1 tablet) take daily.   Lab Work:  Labs done today, your results will be available in MyChart, we will contact you for abnormal readings.   Referrals:  You have been referred to Electrophysiology. You will receive a call from their office to schedule your appointment.    Special Instructions // Education:  Do the following things EVERYDAY: Weigh yourself in the morning before breakfast. Write it down and keep it in a log. Take your medicines as prescribed Eat low salt foods--Limit salt (sodium) to 2000 mg per day.  Stay as active as you can everyday Limit all fluids for the day to less than 2 liters   Follow-Up in: please follow up with your [facility] clinic cardiologist.     If you have any questions or concerns before your next appointment please send [place] a message through [facility] or call our office at [phone] Monday-Friday 8 am-5 pm.   If you have an urgent need after hours on the weekend please call your Primary Cardiologist or the Advanced Heart Failure Clinic in [place] at [phone].

## 2023-04-20 ENCOUNTER — Encounter: Payer: Self-pay | Admitting: Cardiology

## 2023-05-11 ENCOUNTER — Other Ambulatory Visit: Payer: Self-pay | Admitting: Cardiology

## 2023-05-24 ENCOUNTER — Ambulatory Visit
Admission: RE | Admit: 2023-05-24 | Discharge: 2023-05-24 | Disposition: A | Discharge status: Home / Self Care | Payer: Medicare PPO | Source: Ambulatory Visit | Attending: Internal Medicine | Admitting: Internal Medicine

## 2023-05-24 DIAGNOSIS — Z122 Encounter for screening for malignant neoplasm of respiratory organs: Secondary | ICD-10-CM | POA: Diagnosis present

## 2023-05-24 DIAGNOSIS — Z87891 Personal history of nicotine dependence: Secondary | ICD-10-CM | POA: Diagnosis not present

## 2023-06-10 NOTE — Progress Notes (Deleted)
Electrophysiology Office Note:    Date:  06/10/2023   ID:  Christina Carr, DOB [birth year], MRN [id number]  [facility] Cardiologist:  None  [facility] Electrophysiologist:  None   Referring MD: Dorthula Nettles, DO   Chief Complaint: AF  History of Present Illness:    Christina Carr is a 77 y.o. femalewho I am seeing today for an evaluation of AF at the request of Dr Gasper Lloyd.  The patient was last seen by Dr Gasper Lloyd on 04/19/2023.  The patient has a medical history that includes AF, HTN, COPD, depression, mildly reduced EF. She was admitted in December 2023 w decompesated HF requiring iV diuretics. While hospitalized she was in AF w/ RVR. She had a DCCV. She felt better in NSR. She takes eliquis for stroke ppx. She takes amiodarone. Previously saw Dr Juliann Pares.           Their past medical, social and family history was reveiwed.   ROS:   Please see the history of present illness.    All other systems reviewed and are negative.  EKGs/Labs/Other Studies Reviewed:    The following studies were reviewed today:  01/11/2023 Echo EF35 RV low normal Dilated LA        Physical Exam:    VS:  There were no vitals taken for this visit.    Wt Readings from Last 3 Encounters:  04/19/23 133 lb (60.3 kg)  03/23/23 128 lb (58.1 kg)  03/02/23 131 lb (59.4 kg)     GEN: *** Well nourished, well developed in no acute distress CARDIAC: ***RRR, no murmurs, rubs, gallops RESPIRATORY:  Clear to auscultation without rales, wheezing or rhonchi       ASSESSMENT AND PLAN:    No diagnosis found.  #Persistent AF #High risk med monitoring - amiodarone Symptomatic while in AF. Now on amiodarone. I discussed amio in detail today including risks.  Discussed treatment options today for AF including antiarrhythmic drug therapy and ablation. Discussed risks, recovery and likelihood of success with each treatment strategy. Risk, benefits, and alternatives to EP study and  ablation for afib were discussed. These risks include but are not limited to stroke, bleeding, vascular damage, tamponade, perforation, damage to the esophagus, lungs, phrenic nerve and other structures, pulmonary vein stenosis, worsening renal function, coronary vasospasm and death.  Discussed potential need for repeat ablation procedures and antiarrhythmic drugs after an initial ablation. The patient understands these risk and wishes to proceed.  We will therefore proceed with catheter ablation at the next available time.  Carto, ICE, anesthesia are requested for the procedure.  Will also obtain CT PV protocol prior to the procedure to exclude LAA thrombus and further evaluate atrial anatomy.   April TSH and CMP OK for continued amio use.        Signed, Rossie Muskrat. Lalla Brothers, MD, [facility], [facility] 06/10/2023 2:55 PM    Electrophysiology [facility]

## 2023-06-11 ENCOUNTER — Ambulatory Visit: Payer: Medicare PPO | Admitting: Cardiology

## 2023-06-13 ENCOUNTER — Institutional Professional Consult (permissible substitution): Payer: Medicare PPO | Admitting: Cardiology

## 2023-06-17 NOTE — H&P (View-Only) (Signed)
Electrophysiology Office Note:    Date:  06/19/2023   ID:  [name], DOB [birth year], MRN [id number]  [facility] Cardiologist:  None  [facility] Electrophysiologist:  None   Referring MD: Christina Nettles, DO   Chief Complaint: AF  History of Present Illness:    Christina Carr is a 77 y.o. female who I am seeing today for an evaluation of AF at the request of Dr Christina Carr.  The patient was last seen by Dr Christina Carr on 04/19/2023.  The patient has a medical history that includes AF, HTN, COPD, depression, mildly reduced EF. She was admitted in December 2023 w decompesated HF requiring iV diuretics. While hospitalized she was in AF w/ RVR. She had a DCCV. She felt better in NSR. She takes eliquis for stroke ppx. She takes amiodarone. Previously saw Dr Juliann Pares.   Today she tells me she feels poorly.  She can tell her heart is back out of rhythm.  She has been out of rhythm for about 2 weeks or so.  She has severe palpitations.  She has ended up in decompensated heart failure in the past when out of rhythm.  She has been doing reading online about A-fib ablation.        Their past medical, social and family history was reveiwed.   ROS:   Please see the history of present illness.    All other systems reviewed and are negative.  EKGs/Labs/Other Studies Reviewed:    The following studies were reviewed today:  01/11/2023 Echo EF35 RV low normal Dilated LA   EKG Interpretation Date/Time:  Tuesday June 19 2023 09:16:42 EDT Ventricular Rate:  134 PR Interval:    QRS Duration:  88 QT Interval:  328 QTC Calculation: 489 R Axis:   66  Text Interpretation: Atrial fibrillation with rapid ventricular response Confirmed by Steffanie Dunn [phone]) on 06/19/2023 9:17:43 AM    Physical Exam:    VS:  BP 110/74   Pulse 88   Ht 5\' 4"  (1.626 m)   Wt 125 lb 3.2 oz (56.8 kg)   BMI 21.49 kg/m     Wt Readings from Last 3 Encounters:  06/19/23 125 lb 3.2 oz  (56.8 kg)  04/19/23 133 lb (60.3 kg)  03/23/23 128 lb (58.1 kg)     GEN: Well nourished, well developed in no acute distress CARDIAC: Irregularly irregular, no murmurs, rubs, gallops RESPIRATORY:  Clear to auscultation without rales, wheezing or rhonchi       ASSESSMENT AND PLAN:    1. Persistent atrial fibrillation (HCC)   2. Chronic HFrEF (heart failure with reduced ejection fraction) (HCC)   3. Encounter for long-term (current) use of high-risk medication     #Persistent AF #High risk med monitoring - amiodarone Symptomatic while in AF. Now on amiodarone. I discussed amio in detail today including risks. She is back in AF w RVR today. She will need a DCCV. Increase dose of amiodarone to 200mg  PO BID for 10 days, then decrease again to 200mg  PO daily.  I discussed the DCCV procedure in detail including the risks and she wishes to proceed.  She has not missed any doses of anticoagulation in at least the last 4 weeks.  Discussed treatment options today for AF including antiarrhythmic drug therapy and ablation. Discussed risks, recovery and likelihood of success with each treatment strategy. Risk, benefits, and alternatives to EP study and ablation for afib were discussed. These risks include but are not limited to stroke, bleeding, vascular  damage, tamponade, perforation, damage to the esophagus, lungs, phrenic nerve and other structures, pulmonary vein stenosis, worsening renal function, coronary vasospasm and death.  Discussed potential need for repeat ablation procedures and antiarrhythmic drugs after an initial ablation. The patient understands these risk and wishes to proceed.  We will therefore proceed with catheter ablation at the next available time.  Carto, ICE, anesthesia are requested for the procedure.  Will also obtain CT PV protocol prior to the procedure to exclude LAA thrombus and further evaluate atrial anatomy.   April TSH and CMP OK for continued amio  use.   #HFrEF NYHA II today. Follows w Dr Christina Carr. Cont current medication regimen.      Signed, Rossie Muskrat. Lalla Brothers, MD, [facility], [facility] 06/19/2023 9:31 AM    Electrophysiology [facility]

## 2023-06-17 NOTE — Progress Notes (Unsigned)
Electrophysiology Office Note:    Date:  06/19/2023   ID:  Christina SCHRIMSHER, DOB [birth year], MRN [id number]  [facility] Cardiologist:  None  [facility] Electrophysiologist:  None   Referring MD: Dorthula Nettles, DO   Chief Complaint: AF  History of Present Illness:    Christina Carr is a 77 y.o. female who I am seeing today for an evaluation of AF at the request of Dr Christina Carr.  The patient was last seen by Dr Christina Carr on 04/19/2023.  The patient has a medical history that includes AF, HTN, COPD, depression, mildly reduced EF. She was admitted in December 2023 w decompesated HF requiring iV diuretics. While hospitalized she was in AF w/ RVR. She had a DCCV. She felt better in NSR. She takes eliquis for stroke ppx. She takes amiodarone. Previously saw Dr Juliann Pares.   Today she tells me she feels poorly.  She can tell her heart is back out of rhythm.  She has been out of rhythm for about 2 weeks or so.  She has severe palpitations.  She has ended up in decompensated heart failure in the past when out of rhythm.  She has been doing reading online about A-fib ablation.        Their past medical, social and family history was reveiwed.   ROS:   Please see the history of present illness.    All other systems reviewed and are negative.  EKGs/Labs/Other Studies Reviewed:    The following studies were reviewed today:  01/11/2023 Echo EF35 RV low normal Dilated LA   EKG Interpretation Date/Time:  Tuesday June 19 2023 09:16:42 EDT Ventricular Rate:  134 PR Interval:    QRS Duration:  88 QT Interval:  328 QTC Calculation: 489 R Axis:   66  Text Interpretation: Atrial fibrillation with rapid ventricular response Confirmed by Steffanie Dunn [phone]) on 06/19/2023 9:17:43 AM    Physical Exam:    VS:  BP 110/74   Pulse 88   Ht 5\' 4"  (1.626 m)   Wt 125 lb 3.2 oz (56.8 kg)   BMI 21.49 kg/m     Wt Readings from Last 3 Encounters:  06/19/23 125 lb 3.2 oz  (56.8 kg)  04/19/23 133 lb (60.3 kg)  03/23/23 128 lb (58.1 kg)     GEN: Well nourished, well developed in no acute distress CARDIAC: Irregularly irregular, no murmurs, rubs, gallops RESPIRATORY:  Clear to auscultation without rales, wheezing or rhonchi       ASSESSMENT AND PLAN:    1. Persistent atrial fibrillation (HCC)   2. Chronic HFrEF (heart failure with reduced ejection fraction) (HCC)   3. Encounter for long-term (current) use of high-risk medication     #Persistent AF #High risk med monitoring - amiodarone Symptomatic while in AF. Now on amiodarone. I discussed amio in detail today including risks. She is back in AF w RVR today. She will need a DCCV. Increase dose of amiodarone to 200mg  PO BID for 10 days, then decrease again to 200mg  PO daily.  I discussed the DCCV procedure in detail including the risks and she wishes to proceed.  She has not missed any doses of anticoagulation in at least the last 4 weeks.  Discussed treatment options today for AF including antiarrhythmic drug therapy and ablation. Discussed risks, recovery and likelihood of success with each treatment strategy. Risk, benefits, and alternatives to EP study and ablation for afib were discussed. These risks include but are not limited to stroke, bleeding, vascular  damage, tamponade, perforation, damage to the esophagus, lungs, phrenic nerve and other structures, pulmonary vein stenosis, worsening renal function, coronary vasospasm and death.  Discussed potential need for repeat ablation procedures and antiarrhythmic drugs after an initial ablation. The patient understands these risk and wishes to proceed.  We will therefore proceed with catheter ablation at the next available time.  Carto, ICE, anesthesia are requested for the procedure.  Will also obtain CT PV protocol prior to the procedure to exclude LAA thrombus and further evaluate atrial anatomy.   April TSH and CMP OK for continued amio  use.   #HFrEF NYHA II today. Follows w Dr Christina Carr. Cont current medication regimen.      Signed, Rossie Muskrat. Lalla Brothers, MD, [facility], [facility] 06/19/2023 9:31 AM    Electrophysiology [facility]

## 2023-06-19 ENCOUNTER — Ambulatory Visit: Payer: Medicare PPO | Attending: Cardiology | Admitting: Cardiology

## 2023-06-19 ENCOUNTER — Encounter: Payer: Self-pay | Admitting: Cardiology

## 2023-06-19 VITALS — BP 110/74 | HR 88 | Ht 64.0 in | Wt 125.2 lb

## 2023-06-19 DIAGNOSIS — I5022 Chronic systolic (congestive) heart failure: Secondary | ICD-10-CM

## 2023-06-19 DIAGNOSIS — Z01812 Encounter for preprocedural laboratory examination: Secondary | ICD-10-CM | POA: Diagnosis not present

## 2023-06-19 DIAGNOSIS — Z79899 Other long term (current) drug therapy: Secondary | ICD-10-CM

## 2023-06-19 DIAGNOSIS — I4819 Other persistent atrial fibrillation: Secondary | ICD-10-CM | POA: Diagnosis not present

## 2023-06-19 NOTE — Patient Instructions (Addendum)
Medication Instructions:  Your physician has recommended you make the following change in your medication:   ** Increase Amiodarone 200mg  to 1 tablet by mouth twice daily x 10 days then resume Amiodarone 200mg  daily.  *If you need a refill on your cardiac medications before your next appointment, please call your pharmacy*   Lab Work: CBC and BMET today If you have labs (blood work) drawn today and your tests are completely normal, you will receive your results only by: MyChart Message (if you have MyChart) OR A paper copy in the mail If you have any lab test that is abnormal or we need to change your treatment, we will call you to review the results.   Testing/Procedures: Your physician has recommended that you have a Cardioversion (DCCV). Electrical Cardioversion uses a jolt of electricity to your heart either through paddles or wired patches attached to your chest. This is a controlled, usually prescheduled, procedure. Defibrillation is done under light anesthesia in the hospital, and you usually go home the day of the procedure. This is done to get your heart back into a normal rhythm. You are not awake for the procedure. Please see the instruction sheet given to you today.   Your physician has recommended that you have an ablation. Catheter ablation is a medical procedure used to treat some cardiac arrhythmias (irregular heartbeats). During catheter ablation, a long, thin, flexible tube is put into a blood vessel in your groin (upper thigh), or neck. This tube is called an ablation catheter. It is then guided to your heart through the blood vessel. Radio frequency waves destroy small areas of heart tissue where abnormal heartbeats may cause an arrhythmia to start. Please see the instruction sheet given to you today.    Follow-Up: At [facility], you and your health needs are our priority.  As part of our continuing mission to provide you with exceptional heart care, we have  created designated Provider Care Teams.  These Care Teams include your primary Cardiologist (physician) and Advanced Practice Providers (APPs -  Physician Assistants and Nurse Practitioners) who all work together to provide you with the care you need, when you need it.  We recommend signing up for the patient portal called "MyChart".  Sign up information is provided on this After Visit Summary.  MyChart is used to connect with patients for Virtual Visits (Telemedicine).  Patients are able to view lab/test results, encounter notes, upcoming appointments, etc.  Non-urgent messages can be sent to your provider as well.   To learn more about what you can do with MyChart, go to [email or web address].    Your next appointment:   To be scheduled  Other Instructions You are scheduled for a Cardioversion on Wednesday, October 9th with Dr. Debbe Odea.  Please arrive at the [facility] Entrance of [facility], [address], [place] [address] at 630am (This is 1 hour prior to your procedure time).  Proceed to the Check-In Desk directly inside the entrance.  Procedure Parking: Use the entrance off of the [address] side of the hospital. Turn right upon entering and follow the driveway to parking that is directly in front of the [facility]. There is no valet parking available at this entrance, however there is an awning directly in front of the [facility] for drop off/ pick up for patients  DIET: Nothing to eat or drink after midnight except a sip of water with medications (see medication instructions below)  Medication Instructions:                  Begin holding your Jardiance 3 days prior to your procedure. Do not take your Furosemide the morning of your procedure.  You may take your other morning medications with enough water to get them down safely Continue your anticoagulant: Eliquis If you miss a dose, please call us at [phone] You will need  to continue your anticoagulant after your procedure until you are told by your provider that it is safe to stop.   Labs: If patient is on Coumadin, patient needs pt/INR, CBC, BMET within 3 days (No pt/INR needed for patients taking Xarelto, Eliquis, Pradaxa) For patients receiving anesthesia for TEE and all Cardioversion patients: BMET, CBC within 1 week  FYI: For your safety, and to allow [place] to monitor your vital signs accurately during the surgery/procedure we request that if you have artificial nails, gel coating, SNS etc. Please have those removed prior to your surgery/procedure. Not having the nail coverings /polish removed may result in cancellation or delay of your surgery/procedure.  You must have a responsible person to drive you home and stay in the waiting area during your procedure. Failure to do so could result in cancellation.  Bring your insurance cards.  If you have any questions after you get home, please call the office at [phone]  *Special Note: Every effort is made to have your procedure done on time. Occasionally there are emergencies that occur at the hospital that may cause delays. Please be patient if a delay does occur.

## 2023-06-20 LAB — CBC
Hematocrit: 48.5 % — ABNORMAL HIGH (ref 34.0–46.6)
Hemoglobin: 15.8 g/dL (ref 11.1–15.9)
MCH: 31.7 pg (ref 26.6–33.0)
MCHC: 32.6 g/dL (ref 31.5–35.7)
MCV: 97 fL (ref 79–97)
Platelets: 260 10*3/uL (ref 150–450)
RBC: 4.99 x10E6/uL (ref 3.77–5.28)
RDW: 12.6 % (ref 11.7–15.4)
WBC: 6.9 10*3/uL (ref 3.4–10.8)

## 2023-06-20 LAB — BASIC METABOLIC PANEL
BUN/Creatinine Ratio: 15 (ref 12–28)
BUN: 19 mg/dL (ref 8–27)
CO2: 24 mmol/L (ref 20–29)
Calcium: 9.7 mg/dL (ref 8.7–10.3)
Chloride: 97 mmol/L (ref 96–106)
Creatinine, Ser: 1.25 mg/dL — ABNORMAL HIGH (ref 0.57–1.00)
Glucose: 103 mg/dL — ABNORMAL HIGH (ref 70–99)
Potassium: 4.3 mmol/L (ref 3.5–5.2)
Sodium: 136 mmol/L (ref 134–144)
eGFR: 44 mL/min/{1.73_m2} — ABNORMAL LOW (ref >59)

## 2023-06-21 ENCOUNTER — Encounter: Payer: Self-pay | Admitting: Cardiology

## 2023-07-03 MED ORDER — SODIUM CHLORIDE 0.9 % IV SOLN: INTRAVENOUS | Status: DC

## 2023-07-04 ENCOUNTER — Encounter: Payer: Self-pay | Admitting: Cardiology

## 2023-07-04 ENCOUNTER — Ambulatory Visit
Admission: RE | Admit: 2023-07-04 | Discharge: 2023-07-04 | Disposition: A | Discharge status: Home / Self Care | Payer: Medicare PPO | Attending: Cardiology | Admitting: Cardiology

## 2023-07-04 ENCOUNTER — Ambulatory Visit: Payer: Medicare PPO | Admitting: Anesthesiology

## 2023-07-04 ENCOUNTER — Other Ambulatory Visit: Payer: Self-pay

## 2023-07-04 ENCOUNTER — Encounter
Admission: RE | Disposition: A | Discharge status: Home / Self Care | Payer: Self-pay | Source: Home / Self Care | Attending: Cardiology

## 2023-07-04 DIAGNOSIS — I5022 Chronic systolic (congestive) heart failure: Secondary | ICD-10-CM | POA: Insufficient documentation

## 2023-07-04 DIAGNOSIS — J449 Chronic obstructive pulmonary disease, unspecified: Secondary | ICD-10-CM | POA: Diagnosis not present

## 2023-07-04 DIAGNOSIS — I480 Paroxysmal atrial fibrillation: Secondary | ICD-10-CM

## 2023-07-04 DIAGNOSIS — I110 Hypertensive heart disease with heart failure: Secondary | ICD-10-CM | POA: Insufficient documentation

## 2023-07-04 DIAGNOSIS — Z79899 Other long term (current) drug therapy: Secondary | ICD-10-CM | POA: Insufficient documentation

## 2023-07-04 DIAGNOSIS — I4819 Other persistent atrial fibrillation: Secondary | ICD-10-CM | POA: Diagnosis present

## 2023-07-04 HISTORY — PX: CARDIOVERSION: SHX1299

## 2023-07-04 SURGERY — CARDIOVERSION
Anesthesia: General

## 2023-07-04 MED ORDER — PROPOFOL 10 MG/ML IV BOLUS
INTRAVENOUS | Status: DC | PRN
Start: 2023-07-04 — End: 2023-07-04
  Administered 2023-07-04: 30 mg via INTRAVENOUS

## 2023-07-04 NOTE — Transfer of Care (Signed)
Immediate Anesthesia Transfer of Care Note  Patient: Christina Carr  Procedure(s) Performed: CARDIOVERSION  Patient Location: PACU  Anesthesia Type:General  Level of Consciousness: awake, alert , and oriented  Airway & Oxygen Therapy: Patient Spontanous Breathing  Post-op Assessment: Report given to RN and Post -op Vital signs reviewed and stable  Post vital signs: Reviewed and stable  Last Vitals:  Vitals Value Taken Time  BP 118/98 07/04/23 0742  Temp    Pulse 63 07/04/23 0743  Resp 18 07/04/23 0743  SpO2 100 % 07/04/23 0743    Last Pain:  Vitals:   07/04/23 0703  TempSrc: Oral  PainSc: 0-No pain         Complications: No notable events documented.

## 2023-07-04 NOTE — Interval H&P Note (Signed)
History and Physical Interval Note:  07/04/2023 7:44 AM  Christina Carr  has presented today for surgery, with the diagnosis of persistent Afib.  The various methods of treatment have been discussed with the patient and family. After consideration of risks, benefits and other options for treatment, the patient has consented to  Procedure(s): CARDIOVERSION (N/A) as a surgical intervention.  The patient's history has been reviewed, patient examined, no change in status, stable for surgery.  I have reviewed the patient's chart and labs.  Questions were answered to the patient's satisfaction.     Arlys John Agbor-Etang

## 2023-07-04 NOTE — Anesthesia Preprocedure Evaluation (Addendum)
Anesthesia Evaluation  Patient identified by MRN, date of birth, ID band Patient awake    Reviewed: Allergy & Precautions, NPO status , Patient's Chart, lab work & pertinent test results, reviewed documented beta blocker date and time   History of Anesthesia Complications (+) PONV and history of anesthetic complications  Airway Mallampati: II  TM Distance: >3 FB Neck ROM: full    Dental no notable dental hx.    Pulmonary COPD, former smoker   Pulmonary exam normal        Cardiovascular hypertension, Pt. on home beta blockers and Pt. on medications +CHF (HFrEF)  + dysrhythmias Atrial Fibrillation  Rhythm:irregular Rate:Tachycardia   ECHO 1. Left ventricular ejection fraction, by estimation, is 35 to 40%. The  left ve 1. Left ventricular ejection fraction, by estimation, is 35 to 40%. The  left ventricle has moderately decreased function.   2. Right ventricular systolic function is low normal. The right  ventricular size is normal.   3. Left atrial size was severely dilated. No left atrial/left atrial  appendage thrombus was detected.   4. Right atrial size was mildly dilated.   5. The mitral valve is normal in structure. Trivial mitral valve  regurgitation.   6. The aortic valve is normal in structure. There is mild thickening of  the aortic valve. Aortic valve regurgitation is not visualized. No aortic  stenosis is present.  ntricle has moderately decreased function.   2. Right ventricular systolic function is low normal. The right  ventricular size is normal.   3. Left atrial size was severely dilated. No left atrial/left atrial  appendage thrombus was detected.   4. Right atrial size was mildly dilated.   5. The mitral valve is normal in structure. Trivial mitral valve  regurgitation.   6. The aortic valve is normal in structure. There is mild thickening of  the aortic valve. Aortic valve regurgitation is not visualized.  No aortic  stenosis is present.     Neuro/Psych  PSYCHIATRIC DISORDERS Anxiety Depression    negative neurological ROS     GI/Hepatic negative GI ROS, Neg liver ROS,,,  Endo/Other  Hypothyroidism    Renal/GU negative Renal ROS  negative genitourinary   Musculoskeletal   Abdominal   Peds  Hematology negative hematology ROS (+)   Anesthesia Other Findings Past Medical History: No date: Anemia No date: Anxiety No date: Arrhythmia     Comment:  atrial fibrillation No date: Arthritis     Comment:  osteoporosis too No date: Cataracts, bilateral No date: Cervical disc herniation No date: CHF (congestive heart failure) (HCC) No date: COPD (chronic obstructive pulmonary disease) (HCC)     Comment:  mild emphysema No date: Depression No date: Goiter No date: Hypercholesteremia No date: Hypertension     Comment:  8/18 Stress and Echo in 04/2017 WNL No date: Hypothyroidism No date: Osteoporosis 12/29/2015: Personal history of tobacco use, presenting hazards to  health No date: PONV (postoperative nausea and vomiting)     Comment:  only with hysterectomy No date: Thyroid disease  Past Surgical History: No date: ABDOMINAL HYSTERECTOMY 1995: AUGMENTATION MAMMAPLASTY; Bilateral     Comment:  silicone 01/11/2023: CARDIOVERSION; N/A     Comment:  Procedure: CARDIOVERSION;  Surgeon: Dorthula Nettles,               DO;  Location: [facility];  Service:               Cardiovascular;  Laterality: N/A; 07/12/2017: CATARACT EXTRACTION  W/PHACO; Left     Comment:  Procedure: CATARACT EXTRACTION PHACO AND INTRAOCULAR               LENS PLACEMENT (IOC)-LEFT;  Surgeon: Nevada Crane,               MD;  Location: [facility] ORS;  Service: Ophthalmology;                Laterality: Left;  Lot # [id number] H US:00:26.7 AP%:               8.0 CDE: 2.14  08/02/2017: CATARACT EXTRACTION W/PHACO; Right     Comment:  Procedure: CATARACT EXTRACTION PHACO AND INTRAOCULAR                LENS PLACEMENT (IOC)-RIGHT PRE DIABETIC;  Surgeon: Nevada Crane, MD;  Location: [facility] ORS;  Service:               Ophthalmology;  Laterality: Right;  [place] 00:19.9 AP%               12.8 CDE 2.55 [employer] lot # [id number] No date: CESAREAN SECTION No date: COLONOSCOPY 04/29/2018: COLONOSCOPY WITH PROPOFOL; N/A     Comment:  Procedure: COLONOSCOPY WITH PROPOFOL;  Surgeon:               Christena Deem, MD;  Location: [facility];                Service: Endoscopy;  Laterality: N/A; 06/2017: EYE SURGERY; Left     Comment:  cataract extraction 01/11/2023: TEE WITHOUT CARDIOVERSION; N/A     Comment:  Procedure: TRANSESOPHAGEAL ECHOCARDIOGRAM;  Surgeon:               Dorthula Nettles, DO;  Location: [facility];                Service: Cardiovascular;  Laterality: N/A; No date: TUBAL LIGATION  BMI    Body Mass Index: 22.14 kg/m      Reproductive/Obstetrics negative OB ROS                             Anesthesia Physical Anesthesia Plan  ASA: 3  Anesthesia Plan: General   Post-op Pain Management:    Induction: Intravenous  PONV Risk Score and Plan: Propofol infusion and TIVA  Airway Management Planned: Natural Airway and Nasal Cannula  Additional Equipment:   Intra-op Plan:   Post-operative Plan:   Informed Consent: I have reviewed the patients History and Physical, chart, labs and discussed the procedure including the risks, benefits and alternatives for the proposed anesthesia with the patient or authorized representative who has indicated his/her understanding and acceptance.     Dental Advisory Given  Plan Discussed with: Anesthesiologist, CRNA and Surgeon  Anesthesia Plan Comments:        Anesthesia Quick Evaluation

## 2023-07-04 NOTE — Procedures (Signed)
Cardioversion procedure note For atrial fibrillation.  Procedure Details:  Consent: Risks of procedure as well as the alternatives and risks of each were explained to the (patient/caregiver).  Consent for procedure obtained.  Time Out: Verified patient identification, verified procedure, site/side was marked, verified correct patient position, special equipment/implants available, medications/allergies/relevent history reviewed, required imaging and test results available.  Performed  Patient placed on cardiac monitor, pulse oximetry, supplemental oxygen as necessary.   Sedation given: propofol IV per anesthesia team Pacer pads placed anterior and posterior chest.  Cardioverted 1 time(s).   Cardioverted at  150J. Synchronized biphasic Converted to NSR  Evaluation: Findings: Post procedure EKG shows: NSR Complications: None Patient did tolerate procedure well.  Recommendations: Cont amiodarone, eliquis, Toprol xl as prescribed. Keep appointment with EP/afib clinic  Time Spent Directly with the Patient:  25 minutes   Debbe Odea, M.D.

## 2023-07-05 ENCOUNTER — Encounter: Payer: Self-pay | Admitting: Cardiology

## 2023-07-05 NOTE — Anesthesia Postprocedure Evaluation (Signed)
Anesthesia Post Note  Patient: Christina Carr  Procedure(s) Performed: CARDIOVERSION  Patient location during evaluation: PACU Anesthesia Type: General Level of consciousness: awake and alert Pain management: pain level controlled Vital Signs Assessment: post-procedure vital signs reviewed and stable Respiratory status: spontaneous breathing, nonlabored ventilation, respiratory function stable and patient connected to nasal cannula oxygen Cardiovascular status: blood pressure returned to baseline and stable Postop Assessment: no apparent nausea or vomiting Anesthetic complications: no   No notable events documented.   Last Vitals:  Vitals:   07/04/23 0750 07/04/23 0800  BP: 122/76 125/73  Pulse: 67 60  Resp: (!) 21 17  Temp:    SpO2: 99% 100%    Last Pain:  Vitals:   07/04/23 0703  TempSrc: Oral  PainSc: 0-No pain                 Louie Boston

## 2023-07-09 ENCOUNTER — Encounter: Payer: Self-pay | Admitting: Cardiology

## 2023-07-24 NOTE — Progress Notes (Unsigned)
Cardiology Office Note Date:  07/25/2023  Patient ID:  Christina Carr, Christina Carr [birth year], MRN [id number] PCP:  Barbette Reichmann, MD  Cardiologist:  None HF cardiologist: Sabharwal Electrophysiologist: Lanier Prude, MD     Chief Complaint: afib, DCCV follow-up  History of Present Illness: Christina Carr is a 77 y.o. female with PMH notable for Afib, HTN, HFmrEF, hypothyroid, COPD, depression; seen today for Lanier Prude, MD for routine electrophysiology followup.  She was hospitalized 08/2022 w AoCHF requiring IV diuretics. She was in AFib w RVR during hospitalization and is s/p DCCV. She felt better in sinus rhythm.  She last saw Dr. Lalla Brothers 05/2023 for EP consult for afib, was in afib during appointment. Amiodarone increased and repeat DCCV scheduled. She is s/p successful DCCV 10/9. They also discussed AFib ablation.  On follow-up today, she is fairly certain that she is back in afib, but feels better than she usually does when in afib. She has noticed her pulse on home readings is elevated than her typical 50-60 sinus rate, and she has palpitations when lying down at night. She has good energy currently, able to grocery shop, take care of home, and garden without SOB, chest pain, chest pressure, or increased fatigue. Weight is stable, good appetite. She rarely needs PRN lasix, has not taken an dose in many weeks. She brings BP and pulse log. BPs 120-130s, rarely 150; pulse 70s-110s.  She continues to take amiodarone 200mg  daily Continues to take eliquis BID, no missed doses, no bleeding concerns.   She is frustrated that DCCV did not work, and is interested in pursuing ablation as soon as possible.     AAD History: Amiodarone  Past Medical History:  Diagnosis Date   Anemia    Anxiety    Arrhythmia    atrial fibrillation   Arthritis    osteoporosis too   Cataracts, bilateral    Cervical disc herniation    CHF (congestive heart failure) (HCC)    COPD (chronic  obstructive pulmonary disease) (HCC)    mild emphysema   Depression    Goiter    Hypercholesteremia    Hypertension    8/18 Stress and Echo in 04/2017 WNL   Hypothyroidism    Osteoporosis    Personal history of tobacco use, presenting hazards to health 12/29/2015   PONV (postoperative nausea and vomiting)    only with hysterectomy   Thyroid disease     Past Surgical History:  Procedure Laterality Date   ABDOMINAL HYSTERECTOMY     AUGMENTATION MAMMAPLASTY Bilateral 1995   silicone   CARDIOVERSION N/A 01/11/2023   Procedure: CARDIOVERSION;  Surgeon: Dorthula Nettles, DO;  Location: [facility];  Service: Cardiovascular;  Laterality: N/A;   CARDIOVERSION N/A 02/12/2023   Procedure: CARDIOVERSION;  Surgeon: Dorthula Nettles, DO;  Location: [facility];  Service: Cardiovascular;  Laterality: N/A;   CARDIOVERSION N/A 03/23/2023   Procedure: CARDIOVERSION;  Surgeon: Alwyn Pea, MD;  Location: [facility];  Service: Cardiovascular;  Laterality: N/A;   CARDIOVERSION N/A 07/04/2023   Procedure: CARDIOVERSION;  Surgeon: Debbe Odea, MD;  Location: [facility];  Service: Cardiovascular;  Laterality: N/A;   CATARACT EXTRACTION W/PHACO Left 07/12/2017   Procedure: CATARACT EXTRACTION PHACO AND INTRAOCULAR LENS PLACEMENT (IOC)-LEFT;  Surgeon: Nevada Crane, MD;  Location: [facility];  Service: Ophthalmology;  Laterality: Left;  Lot # [id number] H US:00:26.7 AP%: 8.0 CDE: 2.14    CATARACT EXTRACTION W/PHACO Right 08/02/2017   Procedure: CATARACT EXTRACTION PHACO AND INTRAOCULAR LENS  PLACEMENT (IOC)-RIGHT PRE DIABETIC;  Surgeon: Nevada Crane, MD;  Location: [facility];  Service: Ophthalmology;  Laterality: Right;  [place] 00:19.9 AP% 12.8 CDE 2.55 [employer] lot # [id number]   CESAREAN SECTION     COLONOSCOPY     COLONOSCOPY WITH PROPOFOL N/A 04/29/2018   Procedure: COLONOSCOPY WITH PROPOFOL;  Surgeon: Christena Deem, MD;  Location: [facility];  Service: Endoscopy;  Laterality:  N/A;   EYE SURGERY Left 06/2017   cataract extraction   TEE WITHOUT CARDIOVERSION N/A 01/11/2023   Procedure: TRANSESOPHAGEAL ECHOCARDIOGRAM;  Surgeon: Dorthula Nettles, DO;  Location: [facility];  Service: Cardiovascular;  Laterality: N/A;   TUBAL LIGATION      Current Outpatient Medications  Medication Instructions   amiodarone (PACERONE) 200 mg, Oral, Daily   apixaban (ELIQUIS) 5 mg, Oral, 2 times daily   CoQ10 200 mg, Oral, Daily   donepezil (ARICEPT) 5 mg, Oral, Daily at bedtime   DULoxetine (CYMBALTA) 20 mg, Oral, Daily   empagliflozin (JARDIANCE) 10 mg, Oral, Daily before breakfast   furosemide (LASIX) 20 mg, Oral, Daily PRN   ipratropium (ATROVENT) 0.06 % nasal spray Each Nare   levothyroxine (SYNTHROID) 88 mcg, Oral, Daily before breakfast   losartan (COZAAR) 25 mg, Oral, Daily at bedtime   Melatonin 10 mg, Oral, Daily at bedtime   metoprolol succinate (TOPROL XL) 25 mg, Oral, Daily   Multiple Vitamin (MULTIVITAMIN WITH MINERALS) TABS tablet 1 tablet, Oral, Daily with supper, Centrum silver   Polyvinyl Alcohol-Povidone (REFRESH OP) 1 drop, Both Eyes, Daily at bedtime   senna-docusate (SENOKOT-S) 8.6-50 MG tablet 2 tablets, Oral, 2 times daily PRN   simvastatin (ZOCOR) 20 mg, Oral, Daily at bedtime, 2100   Zoledronic Acid (RECLAST IV)     Social History:  The patient  reports that she has quit smoking. She has never used smokeless tobacco. She reports that she does not drink alcohol and does not use drugs.   Family History:  The patient's family history includes Cancer in her mother; Cancer (age of onset: 15) in her sister; Cirrhosis in her father; Hypertension in her sister.  ROS:  Please see the history of present illness. All other systems are reviewed and otherwise negative.   PHYSICAL EXAM:  VS:  BP 110/74 (BP Location: Left Arm, Patient Position: Sitting, Cuff Size: Normal)   Pulse (!) 101   Ht 5\' 4"  (1.626 m)   Wt 124 lb 4 oz (56.4 kg)   SpO2 98%   BMI  21.33 kg/m  BMI: Body mass index is 21.33 kg/m.  GEN- The patient is well appearing, alert and oriented x 3 today, HOH Lungs- Clear to ausculation bilaterally, normal work of breathing.  Heart- Irregularly irregular rate and rhythm, no murmurs, rubs or gallops Extremities- No peripheral edema, warm, dry   EKG is ordered. Personal review of EKG from today shows:    EKG Interpretation Date/Time:  Wednesday July 25 2023 10:08:57 EDT Ventricular Rate:  101 PR Interval:    QRS Duration:  86 QT Interval:  366 QTC Calculation: 474 R Axis:   33  Text Interpretation: Atrial fibrillation with rapid ventricular response ST & T wave abnormality, consider anterior ischemia Confirmed by Sherie Don [phone]) on 07/25/2023 10:17:20 AM    Recent Labs: 09/12/2022: Magnesium 2.1 01/22/2023: ALT 27; TSH 2.731 04/19/2023: B Natriuretic Peptide 175.5 06/19/2023: BUN 19; Creatinine, Ser 1.25; Hemoglobin 15.8; Platelets 260; Potassium 4.3; Sodium 136  No results found for requested labs within last 365 days.  CrCl cannot be calculated (Patient's most recent lab result is older than the maximum 21 days allowed.).   Wt Readings from Last 3 Encounters:  07/25/23 124 lb 4 oz (56.4 kg)  07/04/23 126 lb 1.6 oz (57.2 kg)  06/19/23 125 lb 3.2 oz (56.8 kg)     Additional studies reviewed include: Previous EP, cardiology notes.   TEE, 01/11/2023  1. Left ventricular ejection fraction, by estimation, is 35 to 40%. The left ventricle has moderately decreased function.   2. Right ventricular systolic function is low normal. The right ventricular size is normal.   3. Left atrial size was severely dilated. No left atrial/left atrial appendage thrombus was detected.   4. Right atrial size was mildly dilated.   5. The mitral valve is normal in structure. Trivial mitral valve regurgitation.   6. The aortic valve is normal in structure. There is mild thickening of the aortic valve. Aortic valve regurgitation is  not visualized. No aortic stenosis is present.   TTE, 09/10/2022  1. Left ventricular ejection fraction, by estimation, is 50 to 55%. The left ventricle has low normal function. The left ventricle demonstrates global hypokinesis. The left ventricular internal cavity size was mildly dilated. There is mild left ventricular hypertrophy. Left ventricular diastolic parameters are consistent with Grade I diastolic dysfunction (impaired relaxation).   2. Right ventricular systolic function is normal. The right ventricular size is normal.   3. Left atrial size was mild to moderately dilated.   4. Right atrial size was mildly dilated.   5. The mitral valve is normal in structure. Trivial mitral valve regurgitation.   6. The aortic valve is grossly normal. Aortic valve regurgitation is not visualized. Aortic valve sclerosis is present, with no evidence of aortic valve stenosis.    ASSESSMENT AND PLAN:  #) persis AFib #) high-risk medication - amiodarone S/p DCCV 10/9 with ERAF, unclear when she reverted to afib Ventricular rates well-controlled by home log Good activity tolerance, recommended patient continue to monitor and notify office if worsens. Discussed AF ablation with patient including risks/benefits. Patient verbalized understanding and wished to proceed, scheduled for December 2024 Continue 25mg  toprol daily Continue 200mg  amiodarone  Update thyroid, LFTs   #) Hypercoag d/t persis afib CHA2DS2-VASc Score = 5 [CHF History: 1, HTN History: 1, Diabetes History: 0, Stroke History: 0, Vascular Disease History: 0, Age Score: 2, Gender Score: 1].  Therefore, the patient's annual risk of stroke is 7.2 %.    Stroke ppx - 5mg  eliquis BID, appropriately dosed No bleeding concerns   #) HFrEF NYHA I symptoms currently Warm and dry on exam GDMT: 10mg  jardiance, 25mg  losartan, 25mg  toprol Diuretic: 20mg  lasix PRN, rarely takes       Current medicines are reviewed at length with the patient  today.   The patient does not have concerns regarding her medicines.  The following changes were made today:  none  Labs/ tests ordered today include:  Orders Placed This Encounter  Procedures   CT CARDIAC MORPH/PULM VEIN W/CM&W/O CA SCORE   Hepatic function panel   TSH   T4, free   CBC   Basic metabolic panel   EKG 12-Lead     Disposition: Follow up with Dr. Lalla Brothers or EP APP as usual post procedure   Signed, Sherie Don, NP  07/25/23  10:53 AM  Electrophysiology [facility]

## 2023-07-25 ENCOUNTER — Ambulatory Visit: Payer: Medicare PPO | Attending: Cardiology | Admitting: Cardiology

## 2023-07-25 ENCOUNTER — Encounter: Payer: Self-pay | Admitting: Cardiology

## 2023-07-25 VITALS — BP 110/74 | HR 101 | Ht 64.0 in | Wt 124.2 lb

## 2023-07-25 DIAGNOSIS — Z862 Personal history of diseases of the blood and blood-forming organs and certain disorders involving the immune mechanism: Secondary | ICD-10-CM | POA: Diagnosis not present

## 2023-07-25 DIAGNOSIS — I4819 Other persistent atrial fibrillation: Secondary | ICD-10-CM

## 2023-07-25 DIAGNOSIS — I5022 Chronic systolic (congestive) heart failure: Secondary | ICD-10-CM | POA: Diagnosis not present

## 2023-07-25 DIAGNOSIS — Z79899 Other long term (current) drug therapy: Secondary | ICD-10-CM

## 2023-07-25 NOTE — Patient Instructions (Addendum)
Medication Instructions:  The current medical regimen is effective;  continue present plan and medications.  *If you need a refill on your cardiac medications before your next appointment, please call your pharmacy*   Lab Work:  Your provider would like for you to have following labs drawn around November 18th (this will be all labs needed for procedure, CBC, BMET, TSH, Free T4, LFT)  If you have labs (blood work) drawn today and your tests are completely normal, you will receive your results only by: MyChart Message (if you have MyChart) OR A paper copy in the mail If you have any lab test that is abnormal or we need to change your treatment, we will call you to review the results.   Testing/Procedures: CT MORPH PULM- this will be scheduled 3 weeks before your procedure.   Follow-Up: At [facility], you and your health needs are our priority.  As part of our continuing mission to provide you with exceptional heart care, we have created designated Provider Care Teams.  These Care Teams include your primary Cardiologist (physician) and Advanced Practice Providers (APPs -  Physician Assistants and Nurse Practitioners) who all work together to provide you with the care you need, when you need it.  We recommend signing up for the patient portal called "MyChart".  Sign up information is provided on this After Visit Summary.  MyChart is used to connect with patients for Virtual Visits (Telemedicine).  Patients are able to view lab/test results, encounter notes, upcoming appointments, etc.  Non-urgent messages can be sent to your provider as well.   To learn more about what you can do with MyChart, go to [email or web address].    Your next appointment:   We will call you for follow up appointments.  Other Instructions Follow instruction letter.

## 2023-08-02 ENCOUNTER — Encounter (HOSPITAL_COMMUNITY): Payer: Self-pay

## 2023-08-08 ENCOUNTER — Ambulatory Visit
Admission: RE | Admit: 2023-08-08 | Discharge: 2023-08-08 | Disposition: A | Discharge status: Home / Self Care | Payer: Medicare PPO | Source: Ambulatory Visit | Attending: Cardiology | Admitting: Cardiology

## 2023-08-08 ENCOUNTER — Telehealth: Payer: Self-pay | Admitting: Cardiology

## 2023-08-08 DIAGNOSIS — Z862 Personal history of diseases of the blood and blood-forming organs and certain disorders involving the immune mechanism: Secondary | ICD-10-CM | POA: Insufficient documentation

## 2023-08-08 DIAGNOSIS — I5022 Chronic systolic (congestive) heart failure: Secondary | ICD-10-CM | POA: Diagnosis not present

## 2023-08-08 DIAGNOSIS — I4819 Other persistent atrial fibrillation: Secondary | ICD-10-CM | POA: Diagnosis present

## 2023-08-08 DIAGNOSIS — Z79899 Other long term (current) drug therapy: Secondary | ICD-10-CM | POA: Diagnosis not present

## 2023-08-08 LAB — POCT I-STAT CREATININE: Creatinine, Ser: 1 mg/dL (ref 0.44–1.00)

## 2023-08-08 MED ORDER — IOHEXOL 350 MG/ML SOLN
75.0000 mL | Freq: Once | INTRAVENOUS | Status: AC | PRN
  Administered 2023-08-08: 75 mL via INTRAVENOUS

## 2023-08-08 MED ORDER — SODIUM CHLORIDE 0.9 % IV SOLN: INTRAVENOUS | Status: DC

## 2023-08-08 NOTE — Telephone Encounter (Signed)
Pt c/o BP issue: STAT if pt c/o blurred vision, one-sided weakness or slurred speech  1. What are your last 5 BP readings? 190/127 today   2. Are you having any other symptoms (ex. Dizziness, headache, blurred vision, passed out)? No   3. What is your BP issue? Cannot get BP to come down

## 2023-08-08 NOTE — Telephone Encounter (Signed)
Left message for patient to call back  

## 2023-08-10 ENCOUNTER — Other Ambulatory Visit: Payer: Self-pay | Admitting: Cardiology

## 2023-08-10 NOTE — Telephone Encounter (Signed)
Addressed in MyChart message encounter.

## 2023-08-13 ENCOUNTER — Other Ambulatory Visit: Payer: Self-pay

## 2023-08-13 DIAGNOSIS — Z862 Personal history of diseases of the blood and blood-forming organs and certain disorders involving the immune mechanism: Secondary | ICD-10-CM

## 2023-08-13 DIAGNOSIS — I5022 Chronic systolic (congestive) heart failure: Secondary | ICD-10-CM

## 2023-08-13 DIAGNOSIS — Z79899 Other long term (current) drug therapy: Secondary | ICD-10-CM

## 2023-08-13 DIAGNOSIS — I4819 Other persistent atrial fibrillation: Secondary | ICD-10-CM

## 2023-08-14 ENCOUNTER — Emergency Department
Admission: EM | Admit: 2023-08-14 | Discharge: 2023-08-14 | Disposition: A | Discharge status: Home / Self Care | Payer: Medicare PPO | Attending: Emergency Medicine | Admitting: Emergency Medicine

## 2023-08-14 ENCOUNTER — Encounter: Payer: Self-pay | Admitting: Emergency Medicine

## 2023-08-14 ENCOUNTER — Emergency Department: Payer: Medicare PPO

## 2023-08-14 ENCOUNTER — Other Ambulatory Visit: Payer: Self-pay

## 2023-08-14 DIAGNOSIS — I110 Hypertensive heart disease with heart failure: Secondary | ICD-10-CM | POA: Diagnosis not present

## 2023-08-14 DIAGNOSIS — I509 Heart failure, unspecified: Secondary | ICD-10-CM | POA: Diagnosis not present

## 2023-08-14 DIAGNOSIS — J449 Chronic obstructive pulmonary disease, unspecified: Secondary | ICD-10-CM | POA: Diagnosis not present

## 2023-08-14 DIAGNOSIS — I4891 Unspecified atrial fibrillation: Secondary | ICD-10-CM | POA: Insufficient documentation

## 2023-08-14 DIAGNOSIS — Z7901 Long term (current) use of anticoagulants: Secondary | ICD-10-CM | POA: Insufficient documentation

## 2023-08-14 DIAGNOSIS — R002 Palpitations: Secondary | ICD-10-CM | POA: Diagnosis present

## 2023-08-14 LAB — HEPATIC FUNCTION PANEL
ALT: 49 [IU]/L — ABNORMAL HIGH (ref 0–32)
AST: 44 [IU]/L — ABNORMAL HIGH (ref 0–40)
Albumin: 4.5 g/dL (ref 3.8–4.8)
Alkaline Phosphatase: 97 [IU]/L (ref 44–121)
Bilirubin Total: 0.5 mg/dL (ref 0.0–1.2)
Bilirubin, Direct: 0.17 mg/dL (ref 0.00–0.40)
Total Protein: 6.8 g/dL (ref 6.0–8.5)

## 2023-08-14 LAB — BASIC METABOLIC PANEL
Anion gap: 7 (ref 5–15)
BUN/Creatinine Ratio: 16 (ref 12–28)
BUN: 17 mg/dL (ref 8–27)
BUN: 20 mg/dL (ref 8–23)
CO2: 24 mmol/L (ref 20–29)
CO2: 25 mmol/L (ref 22–32)
Calcium: 9.6 mg/dL (ref 8.7–10.3)
Calcium: 9 mg/dL (ref 8.9–10.3)
Chloride: 98 mmol/L (ref 96–106)
Chloride: 102 mmol/L (ref 98–111)
Creatinine, Ser: 1.06 mg/dL — ABNORMAL HIGH (ref 0.57–1.00)
Creatinine, Ser: 1.15 mg/dL — ABNORMAL HIGH (ref 0.44–1.00)
GFR, Estimated: 49 mL/min — ABNORMAL LOW (ref >60)
Glucose, Bld: 93 mg/dL (ref 70–99)
Glucose: 125 mg/dL — ABNORMAL HIGH (ref 70–99)
Potassium: 4.6 mmol/L (ref 3.5–5.2)
Potassium: 4 mmol/L (ref 3.5–5.1)
Sodium: 138 mmol/L (ref 134–144)
Sodium: 134 mmol/L — ABNORMAL LOW (ref 135–145)
eGFR: 54 mL/min/{1.73_m2} — ABNORMAL LOW (ref >59)

## 2023-08-14 LAB — CBC
HCT: 44.4 % (ref 36.0–46.0)
Hematocrit: 48.6 % — ABNORMAL HIGH (ref 34.0–46.6)
Hemoglobin: 15.8 g/dL (ref 11.1–15.9)
Hemoglobin: 14.9 g/dL (ref 12.0–15.0)
MCH: 31.2 pg (ref 26.6–33.0)
MCH: 31.4 pg (ref 26.0–34.0)
MCHC: 32.5 g/dL (ref 31.5–35.7)
MCHC: 33.6 g/dL (ref 30.0–36.0)
MCV: 96 fL (ref 79–97)
MCV: 93.7 fL (ref 80.0–100.0)
Platelets: 228 10*3/uL (ref 150–450)
Platelets: 227 10*3/uL (ref 150–400)
RBC: 5.06 x10E6/uL (ref 3.77–5.28)
RBC: 4.74 MIL/uL (ref 3.87–5.11)
RDW: 12.4 % (ref 11.7–15.4)
RDW: 12.7 % (ref 11.5–15.5)
WBC: 7.1 10*3/uL (ref 3.4–10.8)
WBC: 7.9 10*3/uL (ref 4.0–10.5)
nRBC: 0 % (ref 0.0–0.2)

## 2023-08-14 LAB — TROPONIN I (HIGH SENSITIVITY)
Troponin I (High Sensitivity): 18 ng/L — ABNORMAL HIGH (ref <18)
Troponin I (High Sensitivity): 18 ng/L — ABNORMAL HIGH (ref <18)

## 2023-08-14 LAB — TSH: TSH: 15.6 u[IU]/mL — ABNORMAL HIGH (ref 0.450–4.500)

## 2023-08-14 LAB — T4, FREE: Free T4: 1.79 ng/dL — ABNORMAL HIGH (ref 0.82–1.77)

## 2023-08-14 MED ORDER — METOPROLOL SUCCINATE ER 25 MG PO TB24
25.0000 mg | ORAL_TABLET | Freq: Once | ORAL | Status: AC
  Administered 2023-08-14: 25 mg via ORAL
  Filled 2023-08-14: qty 1

## 2023-08-14 NOTE — ED Provider Notes (Signed)
[facility] Provider Note    Event Date/Time   First MD Initiated Contact with Patient 08/14/23 2048     (approximate)  History   Chief Complaint: Palpitations  HPI  Christina HAUFLER is a 77 y.o. female with a past medical history of anemia, anxiety, CHF, COPD, hypertension, hyperlipidemia, presents to the emergency department for tachycardia.  Patient has a history of atrial fibrillation currently on Eliquis amiodarone and metoprolol.  Patient was told by her cardiologist if her heart rate increases over 120 she is to call the office.  She called the office this afternoon and they recommended she come to the emergency department for evaluation.  Patient denies any chest pain or shortness of breath at any point.  Patient took her morning medication but has not taken her evening dose of metoprolol yet as she has been in the waiting room.  Physical Exam   Triage Vital Signs: ED Triage Vitals  Encounter Vitals Group     BP 08/14/23 1501 (!) 148/116     Systolic BP Percentile --      Diastolic BP Percentile --      Pulse Rate 08/14/23 1501 (!) 124     Resp 08/14/23 1501 18     Temp 08/14/23 1501 98 F (36.7 C)     Temp Source 08/14/23 1501 Oral     SpO2 08/14/23 1501 98 %     Weight 08/14/23 1502 121 lb (54.9 kg)     Height 08/14/23 1502 5\' 4"  (1.626 m)     Head Circumference --      Peak Flow --      Pain Score 08/14/23 1506 0     Pain Loc --      Pain Education --      Exclude from Growth Chart --     Most recent vital signs: Vitals:   08/14/23 2057 08/14/23 2100  BP: (!) 167/113 (!) 166/132  Pulse: 99 87  Resp:  18  Temp:    SpO2:  98%    General: Awake, no distress.  CV:  Good peripheral perfusion.  Regular rate and rhythm  Resp:  Normal effort.  Equal breath sounds bilaterally.  Abd:  No distention.  Soft, nontender.  No rebound or guarding.  ED Results / Procedures / Treatments   EKG  EKG viewed and interpreted by myself shows  atrial fibrillation at 109 bpm with a narrow QRS, normal axis, normal intervals, nonspecific ST changes.  RADIOLOGY  Chest x-ray viewed and interpreted by myself I do not see any obvious consolidation on my evaluation of the images. Radiology is read the x-ray is negative   MEDICATIONS ORDERED IN ED: Medications  metoprolol succinate (TOPROL-XL) 24 hr tablet 25 mg (25 mg Oral Given 08/14/23 2057)     IMPRESSION / MDM / ASSESSMENT AND PLAN / ED COURSE  I reviewed the triage vital signs and the nursing notes.  Patient's presentation is most consistent with acute presentation with potential threat to life or bodily function.  Patient presents to the emergency department for palpitations/rapid heart rate.  Patient has a history of atrial fibrillation on Eliquis amiodarone and metoprolol succinate 25 mg twice daily.  Patient's heart rate currently around 90 to 100 bpm remained in atrial fibrillation.  We will dose the patient's evening metoprolol succinate dose.  Patient's lab work is reassuring clued a normal CBC, reassuring chemistry and troponin of 18 unchanged x 2.  Given the patient's reassuring workup and  as she is symptom-free in the emergency department we will discharge the patient home with PCP follow-up.  We will recheck the patient's blood pressure prior to discharge.  FINAL CLINICAL IMPRESSION(S) / ED DIAGNOSES   Atrial fibrillation with rapid ventricular response   Note:  This document was prepared using Dragon voice recognition software and may include unintentional dictation errors.   Minna Antis, MD 08/14/23 2150

## 2023-08-14 NOTE — ED Triage Notes (Signed)
Patient to ED via POV for palpitations- states her HR has been elevated for the past year. Sent to ED by cardiologist. Denies CP. Hx of cardioversion. Scheduled for an ablation in 3 weeks. Pt states MD increased her metoprolol last week.

## 2023-08-14 NOTE — Discharge Instructions (Addendum)
Please follow-up with your cardiologist to inform them of today's ER visit.  Return to the emergency department for any significant heart racing any chest pain shortness of breath or any other symptom concerning to yourself.

## 2023-08-16 MED ORDER — LEVOTHYROXINE SODIUM 112 MCG PO TABS: 112.0000 ug | ORAL_TABLET | Freq: Every day | ORAL | 3 refills | Status: DC

## 2023-08-31 ENCOUNTER — Encounter: Payer: Self-pay | Admitting: Cardiology

## 2023-09-03 NOTE — Pre-Procedure Instructions (Signed)
Instructed patient on the following items: Arrival time 0900 Nothing to eat or drink after midnight No meds AM of procedure Responsible person to drive you home and stay with you for 24 hrs  Have you missed any doses of anti-coagulant Eliquis- takes twice a day, hasn't missed any doses.  Don't take dose on morning of procedure.

## 2023-09-05 ENCOUNTER — Ambulatory Visit (HOSPITAL_COMMUNITY)
Admission: RE | Admit: 2023-09-05 | Discharge: 2023-09-05 | Disposition: A | Discharge status: Home / Self Care | Payer: Medicare PPO | Attending: Cardiology | Admitting: Cardiology

## 2023-09-05 ENCOUNTER — Ambulatory Visit (HOSPITAL_BASED_OUTPATIENT_CLINIC_OR_DEPARTMENT_OTHER): Payer: Medicare PPO | Admitting: Anesthesiology

## 2023-09-05 ENCOUNTER — Other Ambulatory Visit: Payer: Self-pay

## 2023-09-05 ENCOUNTER — Other Ambulatory Visit (HOSPITAL_COMMUNITY): Payer: Self-pay

## 2023-09-05 ENCOUNTER — Ambulatory Visit (HOSPITAL_COMMUNITY): Payer: Self-pay | Admitting: Anesthesiology

## 2023-09-05 ENCOUNTER — Ambulatory Visit (HOSPITAL_COMMUNITY)
Admission: RE | Disposition: A | Discharge status: Home / Self Care | Payer: Medicare PPO | Source: Home / Self Care | Attending: Cardiology

## 2023-09-05 DIAGNOSIS — I5022 Chronic systolic (congestive) heart failure: Secondary | ICD-10-CM | POA: Insufficient documentation

## 2023-09-05 DIAGNOSIS — Z79899 Other long term (current) drug therapy: Secondary | ICD-10-CM | POA: Insufficient documentation

## 2023-09-05 DIAGNOSIS — J449 Chronic obstructive pulmonary disease, unspecified: Secondary | ICD-10-CM | POA: Insufficient documentation

## 2023-09-05 DIAGNOSIS — I484 Atypical atrial flutter: Secondary | ICD-10-CM | POA: Diagnosis not present

## 2023-09-05 DIAGNOSIS — I4891 Unspecified atrial fibrillation: Secondary | ICD-10-CM | POA: Diagnosis not present

## 2023-09-05 DIAGNOSIS — I110 Hypertensive heart disease with heart failure: Secondary | ICD-10-CM | POA: Diagnosis not present

## 2023-09-05 DIAGNOSIS — F32A Depression, unspecified: Secondary | ICD-10-CM | POA: Diagnosis not present

## 2023-09-05 DIAGNOSIS — I4819 Other persistent atrial fibrillation: Secondary | ICD-10-CM | POA: Diagnosis not present

## 2023-09-05 DIAGNOSIS — Z7901 Long term (current) use of anticoagulants: Secondary | ICD-10-CM | POA: Insufficient documentation

## 2023-09-05 DIAGNOSIS — I3139 Other pericardial effusion (noninflammatory): Secondary | ICD-10-CM | POA: Diagnosis not present

## 2023-09-05 DIAGNOSIS — Z87891 Personal history of nicotine dependence: Secondary | ICD-10-CM | POA: Insufficient documentation

## 2023-09-05 DIAGNOSIS — E039 Hypothyroidism, unspecified: Secondary | ICD-10-CM | POA: Insufficient documentation

## 2023-09-05 HISTORY — PX: ATRIAL FIBRILLATION ABLATION: EP1191

## 2023-09-05 LAB — POCT ACTIVATED CLOTTING TIME
Activated Clotting Time: 302 s
Activated Clotting Time: 314 s
Activated Clotting Time: 343 s

## 2023-09-05 SURGERY — ATRIAL FIBRILLATION ABLATION
Anesthesia: General

## 2023-09-05 MED ORDER — SODIUM CHLORIDE 0.9 % IV SOLN
INTRAVENOUS | Status: DC
Start: 2023-09-05 — End: 2023-09-05

## 2023-09-05 MED ORDER — COLCHICINE 0.6 MG PO TABS
0.6000 mg | ORAL_TABLET | Freq: Two times a day (BID) | ORAL | Status: DC
  Administered 2023-09-05: 0.6 mg via ORAL
  Filled 2023-09-05: qty 1

## 2023-09-05 MED ORDER — LIDOCAINE 2% (20 MG/ML) 5 ML SYRINGE
INTRAMUSCULAR | Status: DC | PRN
  Administered 2023-09-05: 60 mg via INTRAVENOUS

## 2023-09-05 MED ORDER — FENTANYL CITRATE (PF) 100 MCG/2ML IJ SOLN
INTRAMUSCULAR | Status: AC
  Filled 2023-09-05: qty 2

## 2023-09-05 MED ORDER — ATROPINE SULFATE 1 MG/10ML IJ SOSY
PREFILLED_SYRINGE | INTRAMUSCULAR | Status: AC
  Filled 2023-09-05: qty 10

## 2023-09-05 MED ORDER — PROTAMINE SULFATE 10 MG/ML IV SOLN
INTRAVENOUS | Status: DC | PRN
  Administered 2023-09-05: 30 mg via INTRAVENOUS

## 2023-09-05 MED ORDER — SODIUM CHLORIDE 0.9 % IV SOLN: 250.0000 mL | INTRAVENOUS | Status: DC | PRN

## 2023-09-05 MED ORDER — HEPARIN SODIUM (PORCINE) 1000 UNIT/ML IJ SOLN
INTRAMUSCULAR | Status: AC
  Filled 2023-09-05: qty 10

## 2023-09-05 MED ORDER — SODIUM CHLORIDE 0.9% FLUSH: 3.0000 mL | Freq: Two times a day (BID) | INTRAVENOUS | Status: DC

## 2023-09-05 MED ORDER — ONDANSETRON HCL 4 MG/2ML IJ SOLN: 4.0000 mg | Freq: Once | INTRAMUSCULAR | Status: DC | PRN

## 2023-09-05 MED ORDER — COLCHICINE 0.6 MG PO TABS
0.6000 mg | ORAL_TABLET | Freq: Two times a day (BID) | ORAL | 0 refills | Status: DC
  Filled 2023-09-05: qty 10, 5d supply, fill #0

## 2023-09-05 MED ORDER — ONDANSETRON HCL 4 MG/2ML IJ SOLN
INTRAMUSCULAR | Status: DC | PRN
  Administered 2023-09-05: 4 mg via INTRAVENOUS

## 2023-09-05 MED ORDER — FENTANYL CITRATE (PF) 250 MCG/5ML IJ SOLN
INTRAMUSCULAR | Status: DC | PRN
  Administered 2023-09-05: 50 ug via INTRAVENOUS

## 2023-09-05 MED ORDER — ONDANSETRON HCL 4 MG/2ML IJ SOLN: 4.0000 mg | Freq: Four times a day (QID) | INTRAMUSCULAR | Status: DC | PRN

## 2023-09-05 MED ORDER — DEXAMETHASONE SODIUM PHOSPHATE 10 MG/ML IJ SOLN
INTRAMUSCULAR | Status: DC | PRN
  Administered 2023-09-05: 5 mg via INTRAVENOUS

## 2023-09-05 MED ORDER — SODIUM CHLORIDE 0.9% FLUSH: 3.0000 mL | INTRAVENOUS | Status: DC | PRN

## 2023-09-05 MED ORDER — PANTOPRAZOLE SODIUM 40 MG PO TBEC
40.0000 mg | DELAYED_RELEASE_TABLET | Freq: Every day | ORAL | 0 refills | Status: DC
  Filled 2023-09-05: qty 45, 45d supply, fill #0

## 2023-09-05 MED ORDER — HEPARIN SODIUM (PORCINE) 1000 UNIT/ML IJ SOLN
INTRAMUSCULAR | Status: DC | PRN
  Administered 2023-09-05: 1000 [IU] via INTRAVENOUS

## 2023-09-05 MED ORDER — ACETAMINOPHEN 325 MG PO TABS: 650.0000 mg | ORAL_TABLET | ORAL | Status: DC | PRN

## 2023-09-05 MED ORDER — PANTOPRAZOLE SODIUM 40 MG PO TBEC
40.0000 mg | DELAYED_RELEASE_TABLET | Freq: Every day | ORAL | Status: DC
  Administered 2023-09-05: 40 mg via ORAL
  Filled 2023-09-05: qty 1

## 2023-09-05 MED ORDER — PROPOFOL 10 MG/ML IV BOLUS
INTRAVENOUS | Status: DC | PRN
  Administered 2023-09-05: 20 mg via INTRAVENOUS
  Administered 2023-09-05: 120 mg via INTRAVENOUS

## 2023-09-05 MED ORDER — APIXABAN 5 MG PO TABS
5.0000 mg | ORAL_TABLET | Freq: Two times a day (BID) | ORAL | Status: DC
Start: 2023-09-05 — End: 2023-09-05
  Administered 2023-09-05: 5 mg via ORAL
  Filled 2023-09-05: qty 1

## 2023-09-05 MED ORDER — HEPARIN (PORCINE) IN NACL 1000-0.9 UT/500ML-% IV SOLN
INTRAVENOUS | Status: DC | PRN
  Administered 2023-09-05 (×3): 500 mL

## 2023-09-05 MED ORDER — FENTANYL CITRATE (PF) 100 MCG/2ML IJ SOLN: 25.0000 ug | INTRAMUSCULAR | Status: DC | PRN

## 2023-09-05 MED ORDER — ROCURONIUM BROMIDE 10 MG/ML (PF) SYRINGE
PREFILLED_SYRINGE | INTRAVENOUS | Status: DC | PRN
  Administered 2023-09-05: 10 mg via INTRAVENOUS
  Administered 2023-09-05: 50 mg via INTRAVENOUS

## 2023-09-05 MED ORDER — ACETAMINOPHEN 500 MG PO TABS
ORAL_TABLET | ORAL | Status: AC
  Administered 2023-09-05: 1000 mg via ORAL
  Filled 2023-09-05: qty 2

## 2023-09-05 MED ORDER — SUGAMMADEX SODIUM 200 MG/2ML IV SOLN
INTRAVENOUS | Status: DC | PRN
  Administered 2023-09-05: 150 mg via INTRAVENOUS

## 2023-09-05 MED ORDER — ATROPINE SULFATE 1 MG/10ML IJ SOSY
PREFILLED_SYRINGE | INTRAMUSCULAR | Status: DC | PRN
  Administered 2023-09-05: 1 mg via INTRAVENOUS

## 2023-09-05 MED ORDER — ACETAMINOPHEN 500 MG PO TABS: 1000.0000 mg | ORAL_TABLET | Freq: Once | ORAL | Status: AC

## 2023-09-05 MED ORDER — HEPARIN SODIUM (PORCINE) 1000 UNIT/ML IJ SOLN
INTRAMUSCULAR | Status: DC | PRN
  Administered 2023-09-05: 10000 [IU] via INTRAVENOUS
  Administered 2023-09-05: 3000 [IU] via INTRAVENOUS

## 2023-09-05 MED ORDER — PHENYLEPHRINE 80 MCG/ML (10ML) SYRINGE FOR IV PUSH (FOR BLOOD PRESSURE SUPPORT)
PREFILLED_SYRINGE | INTRAVENOUS | Status: DC | PRN
  Administered 2023-09-05 (×4): 160 ug via INTRAVENOUS
  Administered 2023-09-05 (×2): 80 ug via INTRAVENOUS
  Administered 2023-09-05: 160 ug via INTRAVENOUS
  Administered 2023-09-05: 80 ug via INTRAVENOUS
  Administered 2023-09-05 (×2): 160 ug via INTRAVENOUS

## 2023-09-05 SURGICAL SUPPLY — 22 items
BAG SNAP BAND KOVER 36X36 (MISCELLANEOUS) IMPLANT
BLANKET WARM UNDERBOD FULL ACC (MISCELLANEOUS) ×1 IMPLANT
CABLE PFA RX CATH CONN (CABLE) IMPLANT
CATH ABLAT QDOT MICRO BI TC DF (CATHETERS) IMPLANT
CATH FARAWAVE ABLATION 31 (CATHETERS) IMPLANT
CATH GE 8FR SOUNDSTAR (CATHETERS) IMPLANT
CATH OCTARAY 2.0 F 3-3-3-3-3 (CATHETERS) IMPLANT
CATH WEB BI DIR CSDF CRV REPRO (CATHETERS) IMPLANT
CLOSURE PERCLOSE PROSTYLE (VASCULAR PRODUCTS) IMPLANT
COVER SWIFTLINK CONNECTOR (BAG) ×1 IMPLANT
DILATOR VESSEL 38 20CM 16FR (INTRODUCER) IMPLANT
GUIDEWIRE INQWIRE 1.5J.035X260 (WIRE) IMPLANT
INQWIRE 1.5J .035X260CM (WIRE) ×1
KIT VERSACROSS CNCT FARADRIVE (KITS) IMPLANT
PACK EP LF (CUSTOM PROCEDURE TRAY) ×1 IMPLANT
PAD DEFIB RADIO PHYSIO CONN (PAD) ×1 IMPLANT
PATCH CARTO3 (PAD) IMPLANT
SHEATH FARADRIVE STEERABLE (SHEATH) IMPLANT
SHEATH PINNACLE 8F 10CM (SHEATH) IMPLANT
SHEATH PINNACLE 9F 10CM (SHEATH) IMPLANT
SHEATH PROBE COVER 6X72 (BAG) IMPLANT
TUBING SMART ABLATE COOLFLOW (TUBING) IMPLANT

## 2023-09-05 NOTE — Anesthesia Postprocedure Evaluation (Signed)
Anesthesia Post Note  Patient: Christina Carr  Procedure(s) Performed: ATRIAL FIBRILLATION ABLATION     Patient location during evaluation: PACU Anesthesia Type: General Level of consciousness: awake and alert Pain management: pain level controlled Vital Signs Assessment: post-procedure vital signs reviewed and stable Respiratory status: spontaneous breathing, nonlabored ventilation and respiratory function stable Cardiovascular status: blood pressure returned to baseline and stable Postop Assessment: no apparent nausea or vomiting Anesthetic complications: no   There were no known notable events for this encounter.  Last Vitals:  Vitals:   09/05/23 1315 09/05/23 1330  BP: 109/67 108/63  Pulse: 65 66  Resp: 15 16  Temp:    SpO2: 93% 94%    Last Pain:  Vitals:   09/05/23 1341  TempSrc:   PainSc: 0-No pain                 Collene Schlichter

## 2023-09-05 NOTE — Transfer of Care (Signed)
Immediate Anesthesia Transfer of Care Note  Patient: Christina Carr  Procedure(s) Performed: ATRIAL FIBRILLATION ABLATION  Patient Location: PACU  Anesthesia Type:General  Level of Consciousness: awake and alert   Airway & Oxygen Therapy: Patient Spontanous Breathing and Patient connected to face mask oxygen  Post-op Assessment: Report given to RN and Post -op Vital signs reviewed and stable  Post vital signs: Reviewed and stable  Last Vitals:  Vitals Value Taken Time  BP 107/67 09/05/23 1300  Temp    Pulse    Resp 24 09/05/23 1300  SpO2    Vitals shown include unfiled device data.  Last Pain:  Vitals:   09/05/23 0950  TempSrc: Oral  PainSc:          Complications: There were no known notable events for this encounter.

## 2023-09-05 NOTE — Anesthesia Preprocedure Evaluation (Addendum)
Anesthesia Evaluation  Patient identified by MRN, date of birth, ID band Patient awake    Reviewed: Allergy & Precautions, NPO status , Patient's Chart, lab work & pertinent test results, reviewed documented beta blocker date and time   History of Anesthesia Complications (+) PONV and history of anesthetic complications  Airway Mallampati: I  TM Distance: >3 FB Neck ROM: Full    Dental  (+) Teeth Intact, Dental Advisory Given, Caps,    Pulmonary COPD, former smoker   Pulmonary exam normal breath sounds clear to auscultation       Cardiovascular hypertension, Pt. on medications and Pt. on home beta blockers +CHF  Normal cardiovascular exam+ dysrhythmias Atrial Fibrillation  Rhythm:Regular Rate:Normal  Echo 01/11/23:  1. Left ventricular ejection fraction, by estimation, is 35 to 40%. The  left ventricle has moderately decreased function.   2. Right ventricular systolic function is low normal. The right  ventricular size is normal.   3. Left atrial size was severely dilated. No left atrial/left atrial  appendage thrombus was detected.   4. Right atrial size was mildly dilated.   5. The mitral valve is normal in structure. Trivial mitral valve  regurgitation.   6. The aortic valve is normal in structure. There is mild thickening of  the aortic valve. Aortic valve regurgitation is not visualized. No aortic  stenosis is present.     Neuro/Psych  PSYCHIATRIC DISORDERS Anxiety Depression    negative neurological ROS     GI/Hepatic negative GI ROS, Neg liver ROS,,,  Endo/Other  Hypothyroidism    Renal/GU negative Renal ROS     Musculoskeletal  (+) Arthritis ,    Abdominal   Peds  Hematology negative hematology ROS (+)   Anesthesia Other Findings Day of surgery medications reviewed with the patient.  Reproductive/Obstetrics                             Anesthesia Physical Anesthesia  Plan  ASA: 3  Anesthesia Plan: General   Post-op Pain Management:    Induction: Intravenous  PONV Risk Score and Plan: 4 or greater and Dexamethasone and Ondansetron  Airway Management Planned: Oral ETT  Additional Equipment:   Intra-op Plan:   Post-operative Plan: Extubation in OR  Informed Consent: I have reviewed the patients History and Physical, chart, labs and discussed the procedure including the risks, benefits and alternatives for the proposed anesthesia with the patient or authorized representative who has indicated his/her understanding and acceptance.     Dental advisory given  Plan Discussed with: CRNA  Anesthesia Plan Comments:         Anesthesia Quick Evaluation

## 2023-09-05 NOTE — Progress Notes (Signed)
Patient walked to the bathroom without difficulties. Bilateral groins level 0, clean, dry, and intact.

## 2023-09-05 NOTE — Discharge Instructions (Signed)

## 2023-09-05 NOTE — Anesthesia Procedure Notes (Signed)
Procedure Name: Intubation Date/Time: 09/05/2023 11:01 AM  Performed by: Thomasene Ripple, CRNAPre-anesthesia Checklist: Patient identified, Emergency Drugs available, Suction available and Patient being monitored Patient Re-evaluated:Patient Re-evaluated prior to induction Oxygen Delivery Method: Circle System Utilized Preoxygenation: Pre-oxygenation with 100% oxygen Induction Type: IV induction Ventilation: Mask ventilation without difficulty Laryngoscope Size: Miller and 2 Grade View: Grade I Tube type: Oral Tube size: 7.0 mm Number of attempts: 1 Airway Equipment and Method: Stylet and Oral airway Placement Confirmation: ETT inserted through vocal cords under direct vision, positive ETCO2 and breath sounds checked- equal and bilateral Secured at: 21 cm Tube secured with: Tape Dental Injury: Teeth and Oropharynx as per pre-operative assessment

## 2023-09-05 NOTE — H&P (Signed)
Electrophysiology Office Note:     Date:  09/05/2023    ID:  Christina Carr, DOB April 04, 1946, MRN [id number]   [facility] Cardiologist:  None  [facility] Electrophysiologist:  None    Referring MD: Dorthula Nettles, DO    Chief Complaint: AF   History of Present Illness:     Christina Carr is a 77 y.o. female who I am seeing today for an evaluation of AF at the request of Dr Gasper Lloyd.   The patient was last seen by Dr Gasper Lloyd on 04/19/2023.   The patient has a medical history that includes AF, HTN, COPD, depression, mildly reduced EF. She was admitted in December 2023 w decompesated HF requiring iV diuretics. While hospitalized she was in AF w/ RVR. She had a DCCV. She felt better in NSR. She takes eliquis for stroke ppx. She takes amiodarone. Previously saw Dr Juliann Pares.    Today she tells me she feels poorly.  She can tell her heart is back out of rhythm.  She has been out of rhythm for about 2 weeks or so.  She has severe palpitations.  She has ended up in decompensated heart failure in the past when out of rhythm.  She has been doing reading online about A-fib ablation.    Presents for PVI today.       Objective Their past medical, social and family history was reveiwed.     ROS:   Please see the history of present illness.    All other systems reviewed and are negative.   EKGs/Labs/Other Studies Reviewed:     The following studies were reviewed today:   01/11/2023 Echo EF35 RV low normal Dilated LA     EKG Interpretation Date/Time:                  Tuesday June 19 2023 09:16:42 EDT Ventricular Rate:         134 PR Interval:                   QRS Duration:             88 QT Interval:                 328 QTC Calculation:489 R Axis:                         66   Text Interpretation:Atrial fibrillation with rapid ventricular response Confirmed by Steffanie Dunn [phone]) on 06/19/2023 9:17:43 AM      Physical Exam:     VS:  BP 113/85   Pulse  128   Ht 5\' 4"  (1.626 m)   Wt 125 lb 3.2 oz (56.8 kg)   BMI 21.49 kg/m         Wt Readings from Last 3 Encounters:  06/19/23 125 lb 3.2 oz (56.8 kg)  04/19/23 133 lb (60.3 kg)  03/23/23 128 lb (58.1 kg)      GEN: Well nourished, well developed in no acute distress CARDIAC: Irregularly irregular, no murmurs, rubs, gallops RESPIRATORY:  Clear to auscultation without rales, wheezing or rhonchi          Assessment ASSESSMENT AND PLAN:     1. Persistent atrial fibrillation (HCC)   2. Chronic HFrEF (heart failure with reduced ejection fraction) (HCC)   3. Encounter for long-term (current) use of high-risk medication       #Persistent AF #High risk med monitoring - amiodarone Symptomatic while  in AF. Now on amiodarone. I discussed amio in detail today including risks. She is back in AF w RVR today. She will need a DCCV. Increase dose of amiodarone to 200mg  PO BID for 10 days, then decrease again to 200mg  PO daily.   I discussed the DCCV procedure in detail including the risks and she wishes to proceed.  She has not missed any doses of anticoagulation in at least the last 4 weeks.   Discussed treatment options today for AF including antiarrhythmic drug therapy and ablation. Discussed risks, recovery and likelihood of success with each treatment strategy. Risk, benefits, and alternatives to EP study and ablation for afib were discussed. These risks include but are not limited to stroke, bleeding, vascular damage, tamponade, perforation, damage to the esophagus, lungs, phrenic nerve and other structures, pulmonary vein stenosis, worsening renal function, coronary vasospasm and death.  Discussed potential need for repeat ablation procedures and antiarrhythmic drugs after an initial ablation. The patient understands these risk and wishes to proceed.  We will therefore proceed with catheter ablation at the next available time.  Carto, ICE, anesthesia are requested for the procedure.  Will also  obtain CT PV protocol prior to the procedure to exclude LAA thrombus and further evaluate atrial anatomy.     April TSH and CMP OK for continued amio use.     #HFrEF NYHA II today. Follows w Dr Gasper Lloyd. Cont current medication regimen.      Presents for PVI today. Procedure reviewed.     Signed, Rossie Muskrat. Lalla Brothers, MD, [facility], [facility] 09/05/2023 Electrophysiology [facility]

## 2023-09-06 ENCOUNTER — Encounter (HOSPITAL_COMMUNITY): Payer: Self-pay | Admitting: Cardiology

## 2023-09-06 MED FILL — Fentanyl Citrate Preservative Free (PF) Inj 100 MCG/2ML: INTRAMUSCULAR | Qty: 1 | Status: AC

## 2023-09-11 ENCOUNTER — Other Ambulatory Visit: Payer: Self-pay | Admitting: Family

## 2023-09-24 ENCOUNTER — Telehealth: Payer: Self-pay | Admitting: *Deleted

## 2023-09-24 ENCOUNTER — Ambulatory Visit: Payer: Medicare PPO | Attending: Medical | Admitting: Medical

## 2023-09-24 ENCOUNTER — Encounter: Payer: Self-pay | Admitting: Medical

## 2023-09-24 VITALS — BP 164/96 | HR 70 | Ht 64.0 in | Wt 117.0 lb

## 2023-09-24 DIAGNOSIS — I5022 Chronic systolic (congestive) heart failure: Secondary | ICD-10-CM

## 2023-09-24 DIAGNOSIS — I4819 Other persistent atrial fibrillation: Secondary | ICD-10-CM

## 2023-09-24 DIAGNOSIS — I10 Essential (primary) hypertension: Secondary | ICD-10-CM

## 2023-09-24 MED ORDER — LOSARTAN POTASSIUM 50 MG PO TABS: 75.0000 mg | ORAL_TABLET | Freq: Every day | ORAL | 3 refills | Status: DC

## 2023-09-24 NOTE — Progress Notes (Signed)
Cardiology Office Note:    Date:  09/24/2023   ID:  Christina Carr, DOB 12-19-1945, MRN [id number]  PCP:  Barbette Reichmann, MD  [facility] Cardiologist:  None  [facility] Electrophysiologist:  Lanier Prude, MD   Referring MD: Barbette Reichmann, MD   Chief Complaint: elevated BP  History of Present Illness:    Christina Carr is a 77 y.o. female with a hx of atrial fibrillation, HTN, COPD, depression, heart failure with mildly reduced EF, hypothyroidism who presents for high blood pressure.  Patient was admitted to [facility] in December 2023 with acute hypoxic respiratory failure requiring IV diuretics and was noted to be in A-fib with RVR with rates 1 50-1 90s.  She was diuresed and started on IV diltiazem and discharged home.  Unfortunately, since that time it has been difficult to maintain normal sinus rhythm.  She is followed by EP.  Patient is taking amiodarone.  She has undergone prior cardioversion and underwent recent ablation on 09/05/2023.  Today , the patient reports high Bps for the last month. BP at home 150-170s/80-90s. She was taking Losartan 25mg  BID. Pulse has been good. She denies chest pain or SOB, she just feels tire. She takes her meds at 9pm and goes to bed after.   Past Medical History:  Diagnosis Date   Anemia    Anxiety    Arrhythmia    atrial fibrillation   Arthritis    osteoporosis too   Cataracts, bilateral    Cervical disc herniation    CHF (congestive heart failure) (HCC)    COPD (chronic obstructive pulmonary disease) (HCC)    mild emphysema   Depression    Goiter    Hypercholesteremia    Hypertension    8/18 Stress and Echo in 04/2017 WNL   Hypothyroidism    Osteoporosis    Personal history of tobacco use, presenting hazards to health 12/29/2015   PONV (postoperative nausea and vomiting)    only with hysterectomy   Thyroid disease     Past Surgical History:  Procedure Laterality Date   ABDOMINAL HYSTERECTOMY     ATRIAL  FIBRILLATION ABLATION N/A 09/05/2023   Procedure: ATRIAL FIBRILLATION ABLATION;  Surgeon: Lanier Prude, MD;  [facility]: [facility];  Service: Cardiovascular;  Laterality: N/A;   AUGMENTATION MAMMAPLASTY Bilateral 1995   silicone   CARDIOVERSION N/A 01/11/2023   Procedure: CARDIOVERSION;  Surgeon: Dorthula Nettles, DO;  Location: [facility] INVASIVE CV LAB;  Service: Cardiovascular;  Laterality: N/A;   CARDIOVERSION N/A 02/12/2023   Procedure: CARDIOVERSION;  Surgeon: Dorthula Nettles, DO;  Location: [facility] ORS;  Service: Cardiovascular;  Laterality: N/A;   CARDIOVERSION N/A 03/23/2023   Procedure: CARDIOVERSION;  Surgeon: Alwyn Pea, MD;  Location: [facility] ORS;  Service: Cardiovascular;  Laterality: N/A;   CARDIOVERSION N/A 07/04/2023   Procedure: CARDIOVERSION;  Surgeon: Debbe Odea, MD;  Location: [facility] ORS;  Service: Cardiovascular;  Laterality: N/A;   CATARACT EXTRACTION W/PHACO Left 07/12/2017   Procedure: CATARACT EXTRACTION PHACO AND INTRAOCULAR LENS PLACEMENT (IOC)-LEFT;  Surgeon: Nevada Crane, MD;  Location: [facility] ORS;  Service: Ophthalmology;  Laterality: Left;  Lot # [id number] H US:00:26.7 AP%: 8.0 CDE: 2.14    CATARACT EXTRACTION W/PHACO Right 08/02/2017   Procedure: CATARACT EXTRACTION PHACO AND INTRAOCULAR LENS PLACEMENT (IOC)-RIGHT PRE DIABETIC;  Surgeon: Nevada Crane, MD;  Location: [facility] ORS;  Service: Ophthalmology;  Laterality: Right;  [place] 00:19.9 AP% 12.8 CDE 2.55 [employer] lot # [id number]   CESAREAN SECTION  COLONOSCOPY     COLONOSCOPY WITH PROPOFOL N/A 04/29/2018   Procedure: COLONOSCOPY WITH PROPOFOL;  Surgeon: Christena Deem, MD;  Location[facility]COPY;  Service: Endoscopy;  Laterality: N/A;   EYE SURGERY Left 06/2017   cataract extraction   TEE WITHOUT CARDIOVERSION N/A 01/11/2023   Procedure: TRANSESOPHAGEAL ECHOCARDIOGRAM;  Surgeon: Dorthula Nettles, DO;  Location: [facility] CV LAB;  Service: Cardiovascular;  Laterality: N/A;    TUBAL LIGATION      Current Medications: Current Meds  Medication Sig   amiodarone (PACERONE) 200 MG tablet Take 1 tablet (200 mg total) by mouth daily.   apixaban (ELIQUIS) 5 MG TABS tablet Take 1 tablet (5 mg total) by mouth 2 (two) times daily.   Coenzyme Q10 (COQ10) 200 MG CAPS Take 200 mg by mouth daily.   donepezil (ARICEPT) 5 MG tablet Take 5 mg by mouth at bedtime.   DULoxetine (CYMBALTA) 20 MG capsule Take 20 mg by mouth daily.   empagliflozin (JARDIANCE) 10 MG TABS tablet TAKE 1 TABLET EVERY DAY BEFORE BREAKFAST   furosemide (LASIX) 20 MG tablet Take 20 mg by mouth daily as needed for fluid or edema (If gain 2 lbs over night).   GLUCOSAMINE-CHONDROITIN PO Take 1 tablet by mouth daily.   ipratropium (ATROVENT) 0.06 % nasal spray Place 1 spray into both nostrils 3 (three) times daily.   levothyroxine (SYNTHROID) 112 MCG tablet Take 1 tablet (112 mcg total) by mouth daily before breakfast.   Melatonin 10 MG TABS Take 10 mg by mouth at bedtime.   metoprolol succinate (TOPROL XL) 25 MG 24 hr tablet Take 1 tablet (25 mg total) by mouth daily. (Patient taking differently: Take 25 mg by mouth daily. 1 tab at 7am and 1 tab at 7pm)   Polyvinyl Alcohol-Povidone (REFRESH OP) Place 1 drop into both eyes at bedtime.   senna-docusate (SENOKOT-S) 8.6-50 MG tablet Take 2 tablets by mouth 2 (two) times daily as needed for mild constipation.   simvastatin (ZOCOR) 20 MG tablet Take 20 mg by mouth at bedtime. 2100   Zoledronic Acid (RECLAST IV) See admin instructions. Every 2 years   [DISCONTINUED] losartan (COZAAR) 25 MG tablet Take 1 tablet (25 mg total) by mouth at bedtime.     Allergies:   Alendronate, Hydrochlorothiazide, and Sulfa antibiotics   Social History   Socioeconomic History   Marital status: Divorced    Spouse name: Not on file   Number of children: 2   Years of education: Not on file   Highest education level: Not on file  Occupational History   Not on file  Tobacco Use    Smoking status: Former   Smokeless tobacco: Never  Vaping Use   Vaping status: Former  Substance and Sexual Activity   Alcohol use: No   Drug use: No   Sexual activity: Not on file  Other Topics Concern   Not on file  Social History Narrative   Lives by herself.    Social Drivers of Corporate investment banker Strain: Low Risk  (05/04/2023)   Received from [facility]   Overall Financial Resource Strain (CARDIA)    Difficulty of Paying Living Expenses: Not hard at all  Food Insecurity: No Food Insecurity (05/04/2023)   Received from [facility]   Hunger Vital Sign    Worried About Running Out of Food in the Last Year: Never true    Ran Out of Food in the Last Year: Never true  Transportation  Needs: No Transportation Needs (05/04/2023)   Received from [facility] - Transportation    In the past 12 months, has lack of transportation kept you from medical appointments or from getting medications?: No    Lack of Transportation (Non-Medical): No  Physical Activity: Not on file  Stress: Not on file  Social Connections: Not on file     Family History: The patient's family history includes Cancer in her mother; Cancer (age of onset: 33) in her sister; Cirrhosis in her father; Hypertension in her sister. There is no history of Breast cancer.  ROS:   Please see the history of present illness.     All other systems reviewed and are negative.  EKGs/Labs/Other Studies Reviewed:    The following studies were reviewed today:  Cardiac CT 07/2023   IMPRESSION: 1. There is normal pulmonary vein drainage into the left atrium.   2. The left atrial appendage is a Windsock type with two lobes and ostial size 32 x 29 mm and length 41 mm, Area 72 mm2. There is a filling defect but no thrombus in the left atrial appendage.   3. The esophagus runs in the left atrial midline and is not in the proximity to any of the pulmonary  veins.   4. Coronary calcium score 122.  59th percentile for age/gender.  Echo TEE 12/2022 1. Left ventricular ejection fraction, by estimation, is 35 to 40%. The  left ventricle has moderately decreased function.   2. Right ventricular systolic function is low normal. The right  ventricular size is normal.   3. Left atrial size was severely dilated. No left atrial/left atrial  appendage thrombus was detected.   4. Right atrial size was mildly dilated.   5. The mitral valve is normal in structure. Trivial mitral valve  regurgitation.   6. The aortic valve is normal in structure. There is mild thickening of  the aortic valve. Aortic valve regurgitation is not visualized. No aortic  stenosis is present.   Echo 05/2022 1. Left ventricular ejection fraction, by estimation, is 65 to 70%. The  left ventricle has normal function. The left ventricle has no regional  wall motion abnormalities. There is moderate concentric left ventricular  hypertrophy. Left ventricular  diastolic parameters are consistent with Grade II diastolic dysfunction  (pseudonormalization).   2. Right ventricular systolic function is normal. The right ventricular  size is normal.   3. Left atrial size was mild to moderately dilated.   4. Right atrial size was mild to moderately dilated.   5. The mitral valve is normal in structure. Trivial mitral valve  regurgitation.   6. The aortic valve is calcified. Aortic valve regurgitation is not  visualized. Aortic valve sclerosis/calcification is present, without any  evidence of aortic stenosis.   EKG:  EKG is not ordered today.    Recent Labs: 04/19/2023: B Natriuretic Peptide 175.5 08/13/2023: ALT 49; TSH 15.600 08/14/2023: BUN 20; Creatinine, Ser 1.15; Hemoglobin 14.9; Platelets 227; Potassium 4.0; Sodium 134  Recent Lipid Panel    Component Value Date/Time   CHOL 148 06/22/2022 1829   TRIG 95 06/22/2022 1829   HDL 45 06/22/2022 1829   CHOLHDL 3.3 06/22/2022  1829   VLDL 19 06/22/2022 1829   LDLCALC 84 06/22/2022 1829    Physical Exam:    VS:  BP (!) 164/96 (BP Location: Right Arm, Patient Position: Sitting, Cuff Size: Normal)   Pulse 70   Ht 5\' 4"  (1.626 m)  Wt 117 lb (53.1 kg)   SpO2 93%   BMI 20.08 kg/m     Wt Readings from Last 3 Encounters:  09/24/23 117 lb (53.1 kg)  09/05/23 127 lb (57.6 kg)  08/14/23 121 lb (54.9 kg)     GEN:  Well nourished, well developed in no acute distress HEENT: Normal NECK: No JVD; No carotid bruits LYMPHATICS: No lymphadenopathy CARDIAC: RRR, no murmurs, rubs, gallops RESPIRATORY:  Clear to auscultation without rales, wheezing or rhonchi  ABDOMEN: Soft, non-tender, non-distended MUSCULOSKELETAL:  No edema; No deformity  SKIN: Warm and dry NEUROLOGIC:  Alert and oriented x 3 PSYCHIATRIC:  Normal affect   ASSESSMENT:    1. Essential hypertension   2. Persistent atrial fibrillation (HCC)   3. Chronic systolic heart failure (HCC)    PLAN:    In order of problems listed above:  HTN Patient reports elevated Bps at home, 150-170s/80-90s. She takes losartan and Toprol. She doubled losartan over the weekend with improvement of BP. I will increase Losartan to 75mg  daily. She takes BP meds at night, so unable to check 2 hours after meds. Continue Toprol 25mg  daily.   Persistent Afib S/p prior cardioversion and recent ablation on amiodarone. She is followed by EP. Appears to be in NSR on exam and HR at home have been controlled.   HFrEF Echo TEE 12/2022 showed LVEF 35-40%. 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  Euvolemic on exam. Continue Jardiance, Losartan and Toprol.   Disposition: Follow up in 2 month(s) with MD/APP    Signed, Keiondre Colee David Stall, PA-C  09/24/2023 12:32 PM    [facility]

## 2023-09-24 NOTE — Telephone Encounter (Signed)
Spoke with patient regarding mychart message in regards to her elevated blood pressures. Offered her appointment that we have open today at 10:55 am. She was agreeable with that time and has been scheduled.

## 2023-09-24 NOTE — Patient Instructions (Addendum)
Medication Instructions:   losartan (COZAAR) 50 MG tablet - Take 1.5 tablets (75 mg total) by mouth at bedtime  *If you need a refill on your cardiac medications before your next appointment, please call your pharmacy*  Lab Work: - None ordered  Testing/Procedures: - None ordered  Follow-Up: At [facility], you and your health needs are our priority.  As part of our continuing mission to provide you with exceptional heart care, we have created designated Provider Care Teams.  These Care Teams include your primary Cardiologist (physician) and Advanced Practice Providers (APPs -  Physician Assistants and Nurse Practitioners) who all work together to provide you with the care you need, when you need it.  Your next appointment:   2 month(s)  Provider:   Terrilee Croak, PA-C

## 2023-09-28 ENCOUNTER — Other Ambulatory Visit (HOSPITAL_COMMUNITY): Payer: Self-pay

## 2023-09-28 ENCOUNTER — Other Ambulatory Visit: Payer: Self-pay

## 2023-09-28 DIAGNOSIS — I5022 Chronic systolic (congestive) heart failure: Secondary | ICD-10-CM

## 2023-09-28 MED ORDER — AMIODARONE HCL 200 MG PO TABS: 200.0000 mg | ORAL_TABLET | Freq: Every day | ORAL | 3 refills | Status: DC

## 2023-10-03 ENCOUNTER — Telehealth: Payer: Self-pay | Admitting: Cardiology

## 2023-10-03 ENCOUNTER — Other Ambulatory Visit: Payer: Self-pay

## 2023-10-03 MED ORDER — LEVOTHYROXINE SODIUM 112 MCG PO TABS: 112.0000 ug | ORAL_TABLET | Freq: Every day | ORAL | 3 refills | Status: DC

## 2023-10-03 MED ORDER — LEVOTHYROXINE SODIUM 112 MCG PO TABS: 112.0000 ug | ORAL_TABLET | Freq: Every day | ORAL | 3 refills | Status: AC

## 2023-10-03 NOTE — Telephone Encounter (Signed)
 Please advise on refill request (review 08/13/23 lab results).

## 2023-10-03 NOTE — Telephone Encounter (Signed)
*  STAT* If patient is at the pharmacy, call can be transferred to refill team.   1. Which medications need to be refilled? (please list name of each medication and dose if known)  levothyroxine  (SYNTHROID ) 112 MCG tablet  2. Which pharmacy/location (including street and city if local pharmacy) is medication to be sent to?[facility] - [place], [place] - [address]  3. Do they need a 30 day or 90 day supply?  90 day supply  Jenna with [facility] is requesting to have currently prescription transferred from local pharmacy to [facility].

## 2023-10-03 NOTE — Telephone Encounter (Signed)
 Prescription has been sent to [facility]

## 2023-10-03 NOTE — Progress Notes (Signed)
 Electrophysiology Clinic Note    Date:  10/04/2023  Patient ID:  Christina Carr, Christina Carr 1945/10/14, MRN [id number] PCP:  Sadie Manna, MD  Cardiologist:  Cara JONETTA Lovelace, MD HF cardiologist: [place] Electrophysiologist: OLE ONEIDA HOLTS, MD    Discussed the use of AI scribe software for clinical note transcription with the patient, who gave verbal consent to proceed.   Patient Profile    Chief Complaint: AFib ablation follow-up  History of Present Illness: Christina Carr is a 78 y.o. female with PMH notable for persis AFib, HFmrEF, HTN, hypothyroid, COPD; seen today for OLE ONEIDA HOLTS, MD for routine electrophysiology followup.  She is s/p AF ablation w PVI, posterior wall and atypical flutter bablation on 09/05/2023 with Dr. Holts. The intracardiac echo at the time of procedure demonstrated moderate pericardial effusion.  After ablation, she messaged clinic that she was having severe nausea, vomiting, and diarrhea thought d/t colchicine .   On follow-up today, She reports no episodes of atrial fibrillation since the last visit. However, she has been experiencing high blood pressure readings at home, with the highest reading being 170/105. Her systolic BP has not been below 140 in the past several weeks of her measurements. The patient has been diligent in taking her medications. She takes losartan  nightly. Has been taking her synthroid  with all AM medications. She has not reported any bleeding concerns or missed doss. She prefers to establish ongoing cardiology care with [facility] to consolidate cardiology care       AAD History: Amiodarone      ROS:  Please see the history of present illness. All other systems are reviewed and otherwise negative.    Physical Exam    VS:  BP (!) 170/84 (BP Location: Left Arm, Patient Position: Sitting, Cuff Size: Normal)   Pulse 67   Ht 5' 4 (1.626 m)   Wt 121 lb 9.6 oz (55.2 kg)   SpO2 98%   BMI 20.87 kg/m  BMI: Body  mass index is 20.87 kg/m.  Wt Readings from Last 3 Encounters:  10/04/23 121 lb 9.6 oz (55.2 kg)  09/24/23 117 lb (53.1 kg)  09/05/23 127 lb (57.6 kg)     GEN- The patient is well appearing, alert and oriented x 3 today.   Lungs- Clear to ausculation bilaterally, normal work of breathing.  Heart- Regular rate and rhythm, no murmurs, rubs or gallops Extremities- No peripheral edema, warm, dry    Studies Reviewed   Previous EP, cardiology notes.    EKG is ordered. Personal review of EKG from today shows:    EKG Interpretation Date/Time:  Thursday October 04 2023 14:21:27 EST Ventricular Rate:  67 PR Interval:  166 QRS Duration:  84 QT Interval:  410 QTC Calculation: 433 R Axis:   39  Text Interpretation: Normal sinus rhythm Confirmed by Yarden Manuelito [phone]) on 10/04/2023 2:28:46 PM   CT chest over read, 08/08/2023 1. Stable 3 mm left upper lobe and 5 mm right middle lobe lung nodules versus focal scar. Continuation with the already established annual screening with low-dose chest CT without contrast is recommended. This recommendation follows the consensus statement: Guidelines for Management of Incidental Pulmonary Nodules Detected on CT Images: From the Fleischner Society 2017; Radiology 2017; 284:228-243. 2. Mild posteromedial bilateral lower lobe linear scarring and/or atelectasis.  CTA chest, 08/08/2023 1. There is normal pulmonary vein drainage into the left atrium.  2. The left atrial appendage is a Windsock type with two lobes and ostial size 32  x 29 mm and length 41 mm, Area 72 mm2. There is a filling defect but no thrombus in the left atrial appendage.  3. The esophagus runs in the left atrial midline and is not in the proximity to any of the pulmonary veins.  4. Coronary calcium score 122.  59th percentile for age/gender.  TEE, 01/11/2023  1. Left ventricular ejection fraction, by estimation, is 35 to 40%. The  left ventricle has moderately decreased  function.   2. Right ventricular systolic function is low normal. The right  ventricular size is normal.   3. Left atrial size was severely dilated. No left atrial/left atrial  appendage thrombus was detected.   4. Right atrial size was mildly dilated.   5. The mitral valve is normal in structure. Trivial mitral valve  regurgitation.   6. The aortic valve is normal in structure. There is mild thickening of  the aortic valve. Aortic valve regurgitation is not visualized. No aortic  stenosis is present.     Assessment and Plan     #) persis AFib #) high risk medicaiton use - amiodarone  S/p ablation 08/2023 with Dr. Cindie Maintaining sinus rhythm since procedure Continue amiodarone  200mg  daily Continue 25mg  toprol  BID  #) Hypercoag d/t persis afib CHA2DS2-VASc Score = at least 5 [CHF History: 1, HTN History: 1, Diabetes History: 0, Stroke History: 0, Vascular Disease History: 0, Age Score: 2, Gender Score: 1].  Therefore, the patient's annual risk of stroke is 7.2 %.    Stroke ppx - 5mg  eliquis  BID, appropriately dosed No bleeding concerns  #) HFrEF Euvolemic on exam GDMT includes jardiance , losartan , toprol  Adjust losartan  to AM dosing. Continue checking  BP several times a week. Notify office if BP continues to be elevated  #) hypothyroid Recent thyroid  labs 07/2023 show ongoing hypothyroid Discussed synthroid  administration with patient today - rec to take in AM prior to, and separate from all other AM medications. She has been taking with all AM meds. She has PCP appt next month Defer ongoing hypothyroid mgmt to PCP       Current medicines are reviewed at length with the patient today.   The patient has concerns regarding her medicines.  The following changes were made today:  none  Labs/ tests ordered today include:  Orders Placed This Encounter  Procedures   EKG 12-Lead     Disposition: Follow up with Dr. Cindie or EP APP  in 2 months   Appt with gen  cards MD at next available to establish care.   Signed, Chantal Needle, NP  10/04/23  4:09 PM  Electrophysiology [facility]

## 2023-10-04 ENCOUNTER — Ambulatory Visit: Payer: Medicare PPO | Attending: Cardiology | Admitting: Cardiology

## 2023-10-04 ENCOUNTER — Encounter: Payer: Self-pay | Admitting: Cardiology

## 2023-10-04 VITALS — BP 170/84 | HR 67 | Ht 64.0 in | Wt 121.6 lb

## 2023-10-04 DIAGNOSIS — E039 Hypothyroidism, unspecified: Secondary | ICD-10-CM

## 2023-10-04 DIAGNOSIS — I5022 Chronic systolic (congestive) heart failure: Secondary | ICD-10-CM | POA: Diagnosis not present

## 2023-10-04 DIAGNOSIS — I4819 Other persistent atrial fibrillation: Secondary | ICD-10-CM

## 2023-10-04 DIAGNOSIS — Z862 Personal history of diseases of the blood and blood-forming organs and certain disorders involving the immune mechanism: Secondary | ICD-10-CM

## 2023-10-04 DIAGNOSIS — Z79899 Other long term (current) drug therapy: Secondary | ICD-10-CM | POA: Diagnosis not present

## 2023-10-04 NOTE — Patient Instructions (Signed)
 Medication Instructions:   Take LOSARTAN  in the morning instead of at night  *If you need a refill on your cardiac medications before your next appointment, please call your pharmacy*   Lab Work: None  If you have labs (blood work) drawn today and your tests are completely normal, you will receive your results only by: MyChart Message (if you have MyChart) OR A paper copy in the mail If you have any lab test that is abnormal or we need to change your treatment, we will call you to review the results.   Testing/Procedures: None    Follow-Up: At [facility], you and your health needs are our priority.  As part of our continuing mission to provide you with exceptional heart care, we have created designated Provider Care Teams.  These Care Teams include your primary Cardiologist (physician) and Advanced Practice Providers (APPs -  Physician Assistants and Nurse Practitioners) who all work together to provide you with the care you need, when you need it.  We recommend signing up for the patient portal called MyChart.  Sign up information is provided on this After Visit Summary.  MyChart is used to connect with patients for Virtual Visits (Telemedicine).  Patients are able to view lab/test results, encounter notes, upcoming appointments, etc.  Non-urgent messages can be sent to your provider as well.   To learn more about what you can do with MyChart, go to [email or web address].    Your next appointment:   2 month(s)  Provider:   You will see one of the following Advanced Practice Providers on your designated Care Team:    Aaren Atallah, NP  Other Instructions  Check BP daily and record measurements If BP stays above 140/90, please notify cardiology office for further directions

## 2023-11-27 ENCOUNTER — Ambulatory Visit: Payer: Medicare PPO | Admitting: Medical

## 2023-12-03 NOTE — Progress Notes (Unsigned)
 Cardiology Office Note  Date:  12/04/2023   ID:  TARONDA COMACHO, DOB 1946/02/06, MRN [id number]  PCP:  Barbette Reichmann, MD   Cc: post ablation   HPI:  Christina Carr is a 78 y.o. female with a hx of  atrial fibrillation,  prior cardioversion and underwent  ablation on 09/05/2023. HTN,  COPD,  depression,  heart failure with mildly reduced EF,  hypothyroidism  EF 50 to 55% December 2023 who presents for follow-up of her hypertension and atrial fibrillation   Previously seen by [facility] cardiology, advanced heart failure team, EP  Prior records reviewed admitted to [facility] in December 2023 with acute hypoxic respiratory failure requiring IV diuretics and was noted to be in A-fib with RVR with rates 150-1 90s.   diuresed and started on IV diltiazem  difficult to maintain normal sinus rhythm.   followed by EP.   taking amiodarone.  prior cardioversion and underwent recent ablation on 09/05/2023.  In follow-up today reports that she feels well Denies any tachycardia or palpitations concerning for atrial fibrillation Has blood pressure measurements from home, numbers have been running high 150 up to 170 systolic at times, rare 130 systolic Reports compliance with her medications  Seen by our office January 2025 blood pressure at that time 170 systolic No changes made to medications    PMH:   has a past medical history of Anemia, Anxiety, Arrhythmia, Arthritis, Cataracts, bilateral, Cervical disc herniation, CHF (congestive heart failure) (HCC), COPD (chronic obstructive pulmonary disease) (HCC), Depression, Goiter, Hypercholesteremia, Hypertension, Hypothyroidism, Osteoporosis, Personal history of tobacco use, presenting hazards to health (12/29/2015), PONV (postoperative nausea and vomiting), and Thyroid disease.  PSH:    Past Surgical History:  Procedure Laterality Date   ABDOMINAL HYSTERECTOMY     ATRIAL FIBRILLATION ABLATION N/A 09/05/2023   Procedure: ATRIAL FIBRILLATION  ABLATION;  Surgeon: Lanier Prude, MD;  [facility]: [facility] INVAS[facility] CV LAB;  Service: Cardiovascular;  Laterality: N/A;   AUGMENTATION MAMMAPLASTY Bilateral 1995   silicone   CARDIOVERSION N/A 01/11/2023   Procedure: CARDIOVERSION;  Surgeon: Dorthula Nettles, DO;  Location: [facility] INVASIVE CV LAB;  Service: Cardiovascular;  Laterality: N/A;   CARDIOVERSION N/A 02/12/2023   Procedure: CARDIOVERSION;  Surgeon: Dorthula Nettles, DO;  Location: [facility] ORS;  Service: Cardiovascular;  Laterality: N/A;   CARDIOVERSION N/A 03/23/2023   Procedure: CARDIOVERSION;  Surgeon: Alwyn Pea, MD;  Location: [facility] ORS;  Service: Cardiovascular;  Laterality: N/A;   CARDIOVERSION N/A 07/04/2023   Procedure: CARDIOVERSION;  Surgeon: Debbe Odea, MD;  Location: [facility] ORS;  Service: Cardiovascular;  Laterality: N/A;   CATARACT EXTRACTION W/PHACO Left 07/12/2017   Procedure: CATARACT EXTRACTION PHACO AND INTRAOCULAR LENS PLACEMENT (IOC)-LEFT;  Surgeon: Nevada Crane, MD;  Location: [facility] ORS;  Service: Ophthalmology;  Laterality: Left;  Lot # [id number] H US:00:26.7 AP%: 8.0 CDE: 2.14    CATARACT EXTRACTION W/PHACO Right 08/02/2017   Procedure: CATARACT EXTRACTION PHACO AND INTRAOCULAR LENS PLACEMENT (IOC)-RIGHT PRE DIABETIC;  Surgeon: Nevada Crane, MD;  Location: [facility] ORS;  Service: Ophthalmology;  Laterality: Right;  [place] 00:19.9 AP% 12.8 CDE 2.55 Fly[employer] Pack lot # [id number]   CESAREAN SECTION     COLONOSCOPY     COLONOSCOPY WITH PROPOFOL N/A 04/29/2018   Procedure: COLONOSCOPY WITH PROPOFOL;  Surgeon: Christena Deem, MD;  Location: [facility] ENDOSCOPY;  Service: Endoscopy;  Laterality: N/A;   EYE SURGERY Left 06/2017   cataract extraction   TEE WITHOUT CARDIOVERSION N/A 01/11/2023   Procedure: TRANSESOPHAGEAL ECHOCARDIOGRAM;  Surgeon: Dorthula Nettles, DO;  Location: [facility] INV[facility] CV LAB;  Service: Cardiovascular;  Laterality: N/A;   TUBAL LIGATION      Current Outpatient Medications  Medication Sig  Dispense Refill   amiodarone (PACERONE) 200 MG tablet Take 1 tablet (200 mg total) by mouth daily. 90 tablet 3   apixaban (ELIQUIS) 5 MG TABS tablet Take 1 tablet (5 mg total) by mouth 2 (two) times daily. 60 tablet 2   Coenzyme Q10 (COQ10) 200 MG CAPS Take 200 mg by mouth daily.     donepezil (ARICEPT) 5 MG tablet Take 5 mg by mouth at bedtime.     DULoxetine (CYMBALTA) 20 MG capsule Take 20 mg by mouth daily.     empagliflozin (JARDIANCE) 10 MG TABS tablet TAKE 1 TABLET EVERY DAY BEFORE BREAKFAST 90 tablet 1   furosemide (LASIX) 20 MG tablet Take 20 mg by mouth daily as needed for fluid or edema (If gain 2 lbs over night).     GLUCOSAMINE-CHONDROITIN PO Take 1 tablet by mouth daily.     ipratropium (ATROVENT) 0.06 % nasal spray Place 1 spray into both nostrils 3 (three) times daily.     levothyroxine (SYNTHROID) 112 MCG tablet Take 1 tablet (112 mcg total) by mouth daily before breakfast. 90 tablet 3   losartan (COZAAR) 50 MG tablet Take 1.5 tablets (75 mg total) by mouth at bedtime. 135 tablet 3   Melatonin 10 MG TABS Take 10 mg by mouth at bedtime.     metoprolol succinate (TOPROL-XL) 25 MG 24 hr tablet Take 25 mg by mouth in the morning and at bedtime.     Multiple Vitamin (MULTIVITAMIN WITH MINERALS) TABS tablet Take 1 tablet by mouth daily with supper. Centrum silver     Polyvinyl Alcohol-Povidone (REFRESH OP) Place 1 drop into both eyes at bedtime.     senna-docusate (SENOKOT-S) 8.6-50 MG tablet Take 2 tablets by mouth 2 (two) times daily as needed for mild constipation. 100 tablet 0   simvastatin (ZOCOR) 20 MG tablet Take 20 mg by mouth at bedtime. 2100     Zoledronic Acid (RECLAST IV) See admin instructions. Every 2 years     pantoprazole (PROTONIX) 40 MG tablet Take 1 tablet (40 mg total) by mouth daily. 45 tablet 0   No current facility-administered medications for this visit.     Allergies:   Alendronate, Colchicine, Hydrochlorothiazide, and Sulfa antibiotics   Social History:   The patient  reports that she has quit smoking. She has never used smokeless tobacco. She reports that she does not drink alcohol and does not use drugs.   Family History:   family history includes Cancer in her mother; Cancer (age of onset: 34) in her sister; Cirrhosis in her father; Hypertension in her sister.    Review of Systems: Review of Systems  Constitutional: Negative.   HENT: Negative.    Respiratory: Negative.    Cardiovascular: Negative.   Gastrointestinal: Negative.   Musculoskeletal: Negative.   Neurological: Negative.   Psychiatric/Behavioral: Negative.    All other systems reviewed and are negative.   PHYSICAL EXAM: VS:  BP (!) 148/94   Pulse 67   Ht 5\' 4"  (1.626 m)   Wt 121 lb (54.9 kg)   SpO2 97%   BMI 20.77 kg/m  , BMI Body mass index is 20.77 kg/m. GEN: Well nourished, well developed, in no acute distress HEENT: normal Neck: no JVD, carotid bruits, or masses Cardiac: RRR; no murmurs, rubs, or gallops,no edema  Respiratory:  clear to auscultation bilaterally,  normal work of breathing GI: soft, nontender, nondistended, + BS MS: no deformity or atrophy Skin: warm and dry, no rash Neuro:  Strength and sensation are intact Psych: euthymic mood, full affect  Recent Labs: 04/19/2023: B Natriuretic Peptide 175.5 08/13/2023: ALT 49; TSH 15.600 08/14/2023: BUN 20; Creatinine, Ser 1.15; Hemoglobin 14.9; Platelets 227; Potassium 4.0; Sodium 134    Lipid Panel Lab Results  Component Value Date   CHOL 148 06/22/2022   HDL 45 06/22/2022   LDLCALC 84 06/22/2022   TRIG 95 06/22/2022      Wt Readings from Last 3 Encounters:  12/04/23 121 lb (54.9 kg)  10/04/23 121 lb 9.6 oz (55.2 kg)  09/24/23 117 lb (53.1 kg)     ASSESSMENT AND PLAN:  Problem List Items Addressed This Visit       Cardiology Problems   A-fib [facility]) - Primary   Essential hypertension     Other   Acquired hypothyroidism   COPD (chronic obstructive pulmonary disease) (HCC)    Other Visit Diagnoses       Chronic HFrEF (heart failure with reduced ejection fraction) (HCC)         History of secondary hypercoagulable state         Chronic systolic heart failure (HCC)          Paroxysmal atrial fibrillation Maintaining normal sinus rhythm Continue amiodarone, Eliquis metoprolol succinate 25 twice daily  Essential hypertension Currently taking Lasix 20 daily as needed, losartan 75 daily, metoprolol succinate 25 twice daily Given high blood pressure recommended she increase losartan up to 100 daily, add spironolactone 25 daily Recommend close monitoring of blood pressure at home and call us with numbers  Chronic diastolic CHF In the setting of atrial fibrillation, now maintaining normal sinus rhythm Appears euvolemic, has Lasix as needed    Signed, Dossie Arbour, M.D., Ph.D. [facility] [place], [place] [phone]

## 2023-12-04 ENCOUNTER — Ambulatory Visit: Payer: Medicare PPO | Admitting: Cardiology

## 2023-12-04 ENCOUNTER — Ambulatory Visit: Payer: Medicare PPO | Attending: Cardiovascular Disease | Admitting: Cardiovascular Disease

## 2023-12-04 ENCOUNTER — Encounter: Payer: Self-pay | Admitting: Cardiovascular Disease

## 2023-12-04 VITALS — BP 148/94 | HR 67 | Ht 64.0 in | Wt 121.0 lb

## 2023-12-04 DIAGNOSIS — I5022 Chronic systolic (congestive) heart failure: Secondary | ICD-10-CM | POA: Diagnosis not present

## 2023-12-04 DIAGNOSIS — I10 Essential (primary) hypertension: Secondary | ICD-10-CM

## 2023-12-04 DIAGNOSIS — E039 Hypothyroidism, unspecified: Secondary | ICD-10-CM

## 2023-12-04 DIAGNOSIS — Z862 Personal history of diseases of the blood and blood-forming organs and certain disorders involving the immune mechanism: Secondary | ICD-10-CM | POA: Diagnosis not present

## 2023-12-04 DIAGNOSIS — Z79899 Other long term (current) drug therapy: Secondary | ICD-10-CM

## 2023-12-04 DIAGNOSIS — I4819 Other persistent atrial fibrillation: Secondary | ICD-10-CM | POA: Diagnosis not present

## 2023-12-04 DIAGNOSIS — J449 Chronic obstructive pulmonary disease, unspecified: Secondary | ICD-10-CM

## 2023-12-04 MED ORDER — LOSARTAN POTASSIUM 100 MG PO TABS: 100.0000 mg | ORAL_TABLET | Freq: Every day | ORAL | 3 refills | Status: AC

## 2023-12-04 MED ORDER — SPIRONOLACTONE 25 MG PO TABS: 25.0000 mg | ORAL_TABLET | Freq: Every day | ORAL | 3 refills | Status: AC

## 2023-12-04 NOTE — Patient Instructions (Addendum)
 Medication Instructions:  Please increase the losartan up to 100 mg daily Please start spironolactone 25 mg daily  If you need a refill on your cardiac medications before your next appointment, please call your pharmacy.   Lab work: Your provider would like for you to return in 1 month to have the following labs drawn: BMP.   Please go to [facility] [address] ([facility] [address], [place] [address] You do not need an appointment.  They are open from 8 am- 4:30 pm.  Lunch from 1:00 pm- 2:00 pm You will not need to be fasting.   You may also go to one of the following [facility]:  [address] [place], [place] [address] Phone: [phone] Lab hours: Mon-Fri 8 am- 5 pm    Lunch 12 pm- 1 pm  [address] [place],  [place]  [address]  [place] Phone: [phone] Lab hours: 7 am- 4 pm Lunch 12 pm-1 pm   [address] [place],  [place]  [address]  [place] Phone: [phone] Lab hours: Mon-Fri 8 am- 5 pm    Lunch 12 pm- 1 pm   Testing/Procedures: No new testing needed  Follow-Up: At [facility], you and your health needs are our priority.  As part of our continuing mission to provide you with exceptional heart care, we have created designated Provider Care Teams.  These Care Teams include your primary Cardiologist (physician) and Advanced Practice Providers (APPs -  Physician Assistants and Nurse Practitioners) who all work together to provide you with the care you need, when you need it.  You will need a follow up appointment in 6 months  Providers on your designated Care Team:   Nicolasa Ducking, NP Eula Listen, PA-C Cadence Fransico Michael, [place]  COVID-19 Vaccine Information can be found at: [email or web address] For questions related to vaccine distribution or appointments, please email [email or web address] or call [phone].

## 2023-12-10 ENCOUNTER — Telehealth: Payer: Self-pay | Admitting: Cardiology

## 2023-12-10 ENCOUNTER — Ambulatory Visit: Payer: Medicare PPO | Attending: Cardiology | Admitting: Cardiology

## 2023-12-10 VITALS — BP 153/83 | HR 63 | Ht 64.0 in | Wt 121.8 lb

## 2023-12-10 DIAGNOSIS — I10 Essential (primary) hypertension: Secondary | ICD-10-CM | POA: Diagnosis not present

## 2023-12-10 DIAGNOSIS — Z862 Personal history of diseases of the blood and blood-forming organs and certain disorders involving the immune mechanism: Secondary | ICD-10-CM | POA: Diagnosis not present

## 2023-12-10 DIAGNOSIS — I5022 Chronic systolic (congestive) heart failure: Secondary | ICD-10-CM

## 2023-12-10 DIAGNOSIS — Z79899 Other long term (current) drug therapy: Secondary | ICD-10-CM

## 2023-12-10 DIAGNOSIS — I4819 Other persistent atrial fibrillation: Secondary | ICD-10-CM

## 2023-12-10 NOTE — Telephone Encounter (Signed)
Patient states she was returning a call. Please advise  

## 2023-12-10 NOTE — Progress Notes (Unsigned)
 Electrophysiology Clinic Note    Date:  12/11/2023  Patient ID:  Christina Carr, Christina Carr, MRN [id number] PCP:  Barbette Reichmann, MD  Cardiologist:  Juliann Pares > Julien Nordmann, MD HF cardiologist: Gasper Lloyd Electrophysiologist: Lanier Prude, MD    Discussed the use of AI scribe software for clinical note transcription with the patient, who gave verbal consent to proceed.   Patient Profile    Chief Complaint: AFib ablation follow-up  History of Present Illness: Christina Carr is a 78 y.o. female with PMH notable for persis AFib, HFmrEF, HTN, hypothyroid, COPD; seen today for Lanier Prude, MD for routine electrophysiology followup.  She is s/p AF ablation w PVI, posterior wall and atypical flutter ablation on 09/05/2023 with Dr. Lalla Brothers. The intracardiac echo at the time of procedure demonstrated moderate pericardial effusion.  I saw her for 1 mon post-ablation appt where she was maintaining sinus rhythm.  She saw Dr. Mariah Milling 3/11 noting ongoing HTN, Losartan increased 75 > 100mg . Christina Carr also added.   On follow-up today, she has not had any AF episodes since ablation procedure. Her energy is continuing to rise - she is able to do more housework and yard work. Currently in process of removing fruit trees from her property d/t their inconvenience.  She continues to have elevated BP by home readings, has only measured twice since Dr. Mariah Milling adjusted medications, 130s/70s and 150s/70s. No presyncope, dizziness, LH with position changes.  She continues to take eliquis BID, no bleeding concerns.         Arrhythmia/Device History Amiodarone - long-term     ROS:  Please see the history of present illness. All other systems are reviewed and otherwise negative.    Physical Exam    VS:  BP (!) 153/83 (BP Location: Left Arm, Patient Position: Sitting, Cuff Size: Normal)   Pulse 63   Ht 5\' 4"  (1.626 m)   Wt 121 lb 12.8 oz (55.2 kg)   SpO2 93%   BMI 20.91 kg/m   BMI: Body mass index is 20.91 kg/m.  Wt Readings from Last 3 Encounters:  12/10/23 121 lb 12.8 oz (55.2 kg)  12/04/23 121 lb (54.9 kg)  10/04/23 121 lb 9.6 oz (55.2 kg)     GEN- The patient is well appearing, alert and oriented x 3 today, HOH Lungs- Clear to ausculation bilaterally, normal work of breathing.  Heart- Regular rate and rhythm, no murmurs, rubs or gallops Extremities- No peripheral edema, warm, dry    Studies Reviewed   Previous EP, cardiology notes.    EKG is ordered. Personal review of EKG from today shows:    EKG Interpretation Date/Time:  Monday December 10 2023 14:08:17 EDT Ventricular Rate:  63 PR Interval:  178 QRS Duration:  88 QT Interval:  438 QTC Calculation: 448 R Axis:   30  Text Interpretation: Normal sinus rhythm Minimal voltage criteria for LVH, may be normal variant ( Cornell product ) Confirmed by Sherie Don [phone]) on 12/10/2023 2:11:10 PM    CT chest over read, 08/08/2023 1. Stable 3 mm left upper lobe and 5 mm right middle lobe lung nodules versus focal scar. Continuation with the already established annual screening with low-dose chest CT without contrast is recommended. This recommendation follows the consensus statement: Guidelines for Management of Incidental Pulmonary Nodules Detected on CT Images: From the Fleischner Society 2017; Radiology 2017; 284:228-243. 2. Mild posteromedial bilateral lower lobe linear scarring and/or atelectasis.  CTA chest, 08/08/2023 1. There is normal  pulmonary vein drainage into the left atrium.  2. The left atrial appendage is a Windsock type with two lobes and ostial size 32 x 29 mm and length 41 mm, Area 72 mm2. There is a filling defect but no thrombus in the left atrial appendage.  3. The esophagus runs in the left atrial midline and is not in the proximity to any of the pulmonary veins.  4. Coronary calcium score 122.  59th percentile for age/gender.  TEE, 01/11/2023  1. Left ventricular ejection  fraction, by estimation, is 35 to 40%. The left ventricle has moderately decreased function.   2. Right ventricular systolic function is low normal. The right ventricular size is normal.   3. Left atrial size was severely dilated. No left atrial/left atrial appendage thrombus was detected.   4. Right atrial size was mildly dilated.   5. The mitral valve is normal in structure. Trivial mitral valve regurgitation.   6. The aortic valve is normal in structure. There is mild thickening of the aortic valve. Aortic valve regurgitation is not visualized. No aortic stenosis is present.     Assessment and Plan     #) persis AFib #) high risk medicaiton use - amiodarone S/p AF, Aflu ablation 08/2023 with Dr. Lalla Brothers Maintaining sinus rhythm since procedure Will stop amiodarone at this time Recent LFTs and TSH stable (10/2023 labs) Continue 25mg  toprol BID  #) Hypercoag d/t persis afib CHA2DS2-VASc Score = at least 5 [CHF History: 1, HTN History: 1, Diabetes History: 0, Stroke History: 0, Vascular Disease History: 0, Age Score: 2, Gender Score: 1].  Therefore, the patient's annual risk of stroke is 7.2 %.    Stroke ppx - 5mg  eliquis BID, appropriately dosed No bleeding concerns   #) HFrEF Euvolemic on exam NICM Thought to be tachy-medicated, will update TTE GDMT includes jardiance, losartan, toprol, spiro Dr. Mariah Milling recently increased losartan and added spiro Continue checking BP several times a week.  Notify office if BP continues to be elevated         Current medicines are reviewed at length with the patient today.   The patient has concerns regarding her medicines.  The following changes were made today:  none  Labs/ tests ordered today include:  Orders Placed This Encounter  Procedures   EKG 12-Lead     Disposition: Follow up with Dr. Lalla Brothers or EP APP in 12 months  Follow-up with gen cards in 4-6 weeks with TTE prior to discuss BP, GDMT   Signed, Sherie Don, NP   12/11/23  8:25 AM  Electrophysiology [facility]

## 2023-12-10 NOTE — Patient Instructions (Signed)
Medication Instructions:  The current medical regimen is effective;  continue present plan and medications as directed. Please refer to the Current Medication list given to you today.   *If you need a refill on your cardiac medications before your next appointment, please call your pharmacy*   Follow-Up: At [facility], you and your health needs are our priority.  As part of our continuing mission to provide you with exceptional heart care, we have created designated Provider Care Teams.  These Care Teams include your primary Cardiologist (physician) and Advanced Practice Providers (APPs -  Physician Assistants and Nurse Practitioners) who all work together to provide you with the care you need, when you need it.  We recommend signing up for the patient portal called "MyChart".  Sign up information is provided on this After Visit Summary.  MyChart is used to connect with patients for Virtual Visits (Telemedicine).  Patients are able to view lab/test results, encounter notes, upcoming appointments, etc.  Non-urgent messages can be sent to your provider as well.   To learn more about what you can do with MyChart, go to [email or web address].    Your next appointment:   12 month(s)  Provider:   Steffanie Dunn, MD or Sherie Don, NP

## 2023-12-10 NOTE — Telephone Encounter (Signed)
 The patient stated she received a call from [facility] regarding a medication, but she was unable to hear the voicemail clearly. Nurse informed the patient that there was no notation in the record, but will forward the request to [facility] for clarification.

## 2023-12-11 ENCOUNTER — Telehealth: Payer: Self-pay | Admitting: Cardiology

## 2023-12-11 NOTE — Telephone Encounter (Signed)
 I returned patient's call.  Discussed amiodarone medicaiton. She will stop medicaiton at this time.   I recommended an updated limited TTE to eval her LVEF now that she is maintaining sinus rhythm. Also recommended sooner gen cards appt to discuss TTE results and further BP med adjustments, if needed.   Patient verbalized understanding. Will send mychart message to summarize given her HOH.

## 2023-12-11 NOTE — Telephone Encounter (Signed)
 Called and left a message for pt to schedule a limited echo and follow up appointment with Dunn or [facility]

## 2023-12-11 NOTE — Telephone Encounter (Signed)
 Called and left message.

## 2023-12-14 NOTE — Telephone Encounter (Signed)
 I called and left a message for a follow up appointment after echo.

## 2024-01-04 ENCOUNTER — Ambulatory Visit: Attending: Cardiology

## 2024-01-04 DIAGNOSIS — I5022 Chronic systolic (congestive) heart failure: Secondary | ICD-10-CM | POA: Diagnosis not present

## 2024-01-04 LAB — ECHOCARDIOGRAM LIMITED
Area-P 1/2: 3.99 cm2
S' Lateral: 2.5 cm
Single Plane A4C EF: 55.2 %

## 2024-01-05 ENCOUNTER — Encounter: Payer: Self-pay | Admitting: Cardiovascular Disease

## 2024-01-05 LAB — BASIC METABOLIC PANEL WITH GFR
BUN/Creatinine Ratio: 21 (ref 12–28)
BUN: 20 mg/dL (ref 8–27)
CO2: 24 mmol/L (ref 20–29)
Calcium: 10.2 mg/dL (ref 8.7–10.3)
Chloride: 95 mmol/L — ABNORMAL LOW (ref 96–106)
Creatinine, Ser: 0.95 mg/dL (ref 0.57–1.00)
Glucose: 93 mg/dL (ref 70–99)
Potassium: 4.7 mmol/L (ref 3.5–5.2)
Sodium: 137 mmol/L (ref 134–144)
eGFR: 62 mL/min/{1.73_m2} (ref >59)

## 2024-01-07 NOTE — Progress Notes (Unsigned)
 Cardiology Clinic Note   Date: 01/10/2024 ID: KATRIEL CUTSFORTH, DOB August 08, 1946, MRN [id number]  [facility] Providers Cardiologist:  Julien Nordmann, MD Electrophysiologist:  Lanier Prude, MD {  Chief Complaint   MOLLEY HOUSER is a 78 y.o. female who presents to the clinic today for follow up after medication changes.   Patient Profile   Christina Carr is followed by Dr. Mariah Milling and Dr. Lalla Brothers for the history outlined below.      Past medical history significant for: Chronic diastolic heart failure.  Echo 01/04/2024: EF 55 to 60%.  No RWMA.  Moderate LVH.  Normal RV size/function.  Aortic valve sclerosis without stenosis. PAF. Onset September 2023. DCCV 01/11/2023, 02/12/2023, 03/23/2023. A-fib ablation 09/05/2023. Hyperlipidemia.  Lipid panel 10/29/2023: LDL 97, HDL 76, TG 101, total 193. COPD. Hypothyroidism.  In summary, patient previously followed by [facility] cardiology.  Patient presented to the ED in September 2023 with complaints of weakness, nausea, vomiting she attributed to valacyclovir for shingles.  20 she was found to be in new onset A-fib.  Echo demonstrated EF 65 to 70%, no RWMA, moderate concentric LVH, Grade II DD, normal RV size/function, moderate BAE, aortic valve sclerosis/calcification without stenosis.  She underwent hospital admission in December 2023 for acute hypoxic respiratory failure present.  She was found to be in A-fib with RVR.  She was diuresed with IV Lasix and started on diltiazem.  She was followed by advanced heart failure clinic as an outpatient. She underwent DCCV x 3 in April, May and June of 2024. She was referred to Dr. Lalla Brothers in September 2024. She reported being highly symptomatic when in afib. She underwent afib ablation in December 2024. She was seen in follow up and reported to have high BP readings. Her losartan was increased to 75 mg daily.   Patient established care with Dr. Mariah Milling on 12/04/2023. She reported continued elevated BP.  Losartan was increased to 100 mg daily and spironolactone was added.   Patient was last seen in the office by Sherie Don, NP on 12/10/2023 for routine follow up. She was doing well at that time and maintaining sinus rhythm. No medication changes were made.      History of Present Illness    Today, patient is doing well. Patient denies shortness of breath, dyspnea on exertion, lower extremity edema, orthopnea or PND. No chest pain, pressure, or tightness. No palpitations. She reports a when she is in afib it feels like "a fish flopping in my chest."  She checks her BP every couple of days and it has been improved 130-140/70-80. She denies headaches or dizziness with elevated BP. During the cold months she stays active doing 1-2 miles on her exercise bike. Now that it is warming up she does a lot of gardening and other yard work.     ROS: All other systems reviewed and are otherwise negative except as noted in History of Present Illness.  EKGs/Labs Reviewed        08/13/2023: ALT 49; AST 44 01/04/2024: BUN 20; Creatinine, Ser 0.95; Potassium 4.7; Sodium 137   08/14/2023: Hemoglobin 14.9; WBC 7.9   08/13/2023: TSH 15.600   04/19/2023: B Natriuretic Peptide 175.5   Risk Assessment/Calculations     CHA2DS2-VASc Score = 5   This indicates a 7.2% annual risk of stroke. The patient's score is based upon: CHF History: 1 HTN History: 1 Diabetes History: 0 Stroke History: 0 Vascular Disease History: 0 Age Score: 2 Gender Score: 1  Physical Exam    VS:  BP 134/72 (BP Location: Left Arm, Patient Position: Sitting, Cuff Size: Normal)   Pulse 75   Ht 5\' 4"  (1.626 m)   Wt 123 lb (55.8 kg)   SpO2 98%   BMI 21.11 kg/m  , BMI Body mass index is 21.11 kg/m.  GEN: Well nourished, well developed, in no acute distress. Neck: No JVD or carotid bruits. Cardiac:  RRR. No murmurs. No rubs or gallops.   Respiratory:  Respirations regular and unlabored. Clear to auscultation  without rales, wheezing or rhonchi. GI: Soft, nontender, nondistended. Extremities: Radials/DP/PT 2+ and equal bilaterally. No clubbing or cyanosis. No edema.  Skin: Warm and dry, no rash. Neuro: Strength intact.  Assessment & Plan   Chronic HFpEF Echo April 2025 showed normal LV/RV function. Patient denies lower extremity edema, shortness of breath, DOE, orthopnea or PND.  Euvolemic and well compensated on exam.  -Continue losartan, spironolactone, Toprol, Jardiance.   PAF Onset September 2023. S/p ablation December 2024. Denies spontaneous bleeding concerns. Patient denies palpitations since ablation. RRR on exam today.   -Continue Toprol and Eliquis.  Appropriate Eliquis dose.    Hypertension BP today 140/70 on intake and 134/72 on my recheck. She denies headaches or dizziness when BP is elevated.  -Continue losartan, spironolactone, and Toprol.   Hyperlipidemia LDL 97 February 2025, at goal. - Continue simvastatin.  Disposition: Return in 3 months or sooner as needed.          Signed, Lonell Rives. Tarl Cephas, DNP, NP-C

## 2024-01-10 ENCOUNTER — Encounter: Payer: Self-pay | Admitting: Student

## 2024-01-10 ENCOUNTER — Ambulatory Visit: Attending: Student | Admitting: Student

## 2024-01-10 VITALS — BP 134/72 | HR 75 | Ht 64.0 in | Wt 123.0 lb

## 2024-01-10 DIAGNOSIS — E782 Mixed hyperlipidemia: Secondary | ICD-10-CM

## 2024-01-10 DIAGNOSIS — I10 Essential (primary) hypertension: Secondary | ICD-10-CM | POA: Diagnosis not present

## 2024-01-10 DIAGNOSIS — I480 Paroxysmal atrial fibrillation: Secondary | ICD-10-CM

## 2024-01-10 DIAGNOSIS — I5032 Chronic diastolic (congestive) heart failure: Secondary | ICD-10-CM | POA: Diagnosis not present

## 2024-01-10 NOTE — Patient Instructions (Signed)
 Medication Instructions:  Your physician recommends that you continue on your current medications as directed. Please refer to the Current Medication list given to you today.  *If you need a refill on your cardiac medications before your next appointment, please call your pharmacy*  Follow-Up: At [facility], you and your health needs are our priority.  As part of our continuing mission to provide you with exceptional heart care, our providers are all part of one team.  This team includes your primary Cardiologist (physician) and Advanced Practice Providers or APPs (Physician Assistants and Nurse Practitioners) who all work together to provide you with the care you need, when you need it.  Your next appointment:   3 month(s)  Provider:   Belva Boyden, MD or Morey Ar, NP    We recommend signing up for the patient portal called "MyChart".  Sign up information is provided on this After Visit Summary.  MyChart is used to connect with patients for Virtual Visits (Telemedicine).  Patients are able to view lab/test results, encounter notes, upcoming appointments, etc.  Non-urgent messages can be sent to your provider as well.   To learn more about what you can do with MyChart, go to [email or web address].

## 2024-01-11 ENCOUNTER — Encounter: Payer: Self-pay | Admitting: Cardiovascular Disease

## 2024-02-07 ENCOUNTER — Encounter: Payer: Self-pay | Admitting: Cardiovascular Disease

## 2024-03-11 ENCOUNTER — Other Ambulatory Visit: Payer: Self-pay | Admitting: Cardiology

## 2024-03-14 ENCOUNTER — Encounter: Payer: Self-pay | Admitting: Cardiovascular Disease

## 2024-04-17 ENCOUNTER — Encounter: Payer: Self-pay | Admitting: Cardiovascular Disease

## 2024-04-30 NOTE — Progress Notes (Unsigned)
 Cardiology Clinic Note   Date: 05/02/2024 ID: Christina Carr, DOB 16-Oct-1945, MRN [id number]  [facility] Providers Cardiologist:  Evalene Lunger, MD Electrophysiologist:  OLE ONEIDA HOLTS, MD   Chief Complaint   Christina Carr is a 78 y.o. female who presents to the clinic today for routine follow up.   Patient Profile   Christina Carr is followed by Dr. Gollan and Dr. HOLTS for the history outlined below.       Past medical history significant for: Chronic diastolic heart failure.  Echo 01/04/2024: EF 55 to 60%.  No RWMA.  Moderate LVH.  Normal RV size/function.  Aortic valve sclerosis without stenosis. PAF. Onset September 2023. DCCV 01/11/2023, 02/12/2023, 03/23/2023. A-fib ablation 09/05/2023. Hyperlipidemia.  Lipid panel 04/28/2024: LDL 95, HDL 58, TG 134, total 179. COPD. Hypothyroidism.  In summary, patient previously followed by [facility] cardiology.  Patient presented to the ED in September 2023 with complaints of weakness, nausea, vomiting she attributed to valacyclovir for shingles.  20 she was found to be in new onset A-fib.  Echo demonstrated EF 65 to 70%, no RWMA, moderate concentric LVH, Grade II DD, normal RV size/function, moderate BAE, aortic valve sclerosis/calcification without stenosis.  She underwent hospital admission in December 2023 for acute hypoxic respiratory failure present.  She was found to be in A-fib with RVR.  She was diuresed with IV Lasix  and started on diltiazem .  She was followed by advanced heart failure clinic as an outpatient. She underwent DCCV x 3 in April, May and June of 2024. She was referred to Dr. HOLTS in September 2024. She reported being highly symptomatic when in afib. She underwent afib ablation in December 2024. She was seen in follow up and reported to have high BP readings. Her losartan  was increased to 75 mg daily.   Patient established care with Dr. Gollan on 12/04/2023. She reported continued elevated BP. Losartan  was  increased to 100 mg daily and spironolactone  was added.   Patient was seen in the office by Suzann Riddle, NP on 12/10/2023 for routine follow up. She was doing well at that time and maintaining sinus rhythm. No medication changes were made.    Patient was last seen in the office by me on 01/10/2024 for follow-up after medication changes.  She reported home BP improved with readings 130-140/70-80.     History of Present Illness    Today, patient is here alone. She reports she is feeling well. Patient denies shortness of breath, dyspnea on exertion, lower extremity edema, orthopnea or PND. She has not needed Lasix  recently. No chest pain, pressure, or tightness. No palpitations. She denies blood in her stool or urine. She is very active around her home performing yard work in the mornings before 10 am so she is not out in the heat and sun.     ROS: All other systems reviewed and are otherwise negative except as noted in History of Present Illness.  EKGs/Labs Reviewed    EKG today: NSR 72 bpm, T-wave inversion in V2    08/13/2023: ALT 49; AST 44 01/04/2024: BUN 20; Creatinine, Ser 0.95; Potassium 4.7; Sodium 137   08/14/2023: Hemoglobin 14.9; WBC 7.9   08/13/2023: TSH 15.600    Risk Assessment/Calculations     CHA2DS2-VASc Score = 5   This indicates a 7.2% annual risk of stroke. The patient's score is based upon: CHF History: 1 HTN History: 1 Diabetes History: 0 Stroke History: 0 Vascular Disease History: 0 Age Score: 2 Gender  Score: 1             Physical Exam    VS:  BP (!) 108/56 (BP Location: Left Arm, Patient Position: Sitting, Cuff Size: Normal)   Pulse 74   Ht 5' 4 (1.626 m)   Wt 132 lb (59.9 kg)   SpO2 96%   BMI 22.66 kg/m  , BMI Body mass index is 22.66 kg/m.  GEN: Well nourished, well developed, in no acute distress. Neck: No JVD or carotid bruits. Cardiac:  RRR. No murmur. No rubs or gallops.   Respiratory:  Respirations regular and unlabored. Clear  to auscultation without rales, wheezing or rhonchi. GI: Soft, nontender, nondistended. Extremities: Radials/DP/PT 2+ and equal bilaterally. No clubbing or cyanosis. No edema.  Skin: Warm and dry, no rash. Neuro: Strength intact.  Assessment & Plan   Chronic HFpEF Echo April 2025 showed normal LV/RV function. Patient denies lower extremity edema, shortness of breath, DOE, orthopnea or PND.  Euvolemic and well compensated on exam. -Continue losartan , spironolactone , Toprol , Jardiance .    PAF Onset September 2023. S/p ablation December 2024. Denies spontaneous bleeding concerns. Patient denies palpitations since ablation. RRR on exam today. EKG  shows NSR 72 bpm.  -Continue Toprol  and Eliquis .  Appropriate Eliquis  dose.     Hypertension BP today 108/56. Patient denies lightheadedness or dizziness.  -Continue losartan , spironolactone , and Toprol .    Hyperlipidemia LDL 95 August 2025, at goal. - Continue simvastatin .  Disposition: Return in 6 months or sooner as needed.          Signed, Barnie HERO. Lowell Makara, DNP, NP-C

## 2024-05-02 ENCOUNTER — Encounter: Payer: Self-pay | Admitting: Student

## 2024-05-02 ENCOUNTER — Ambulatory Visit: Attending: Student | Admitting: Student

## 2024-05-02 VITALS — BP 108/56 | HR 74 | Ht 64.0 in | Wt 132.0 lb

## 2024-05-02 DIAGNOSIS — I10 Essential (primary) hypertension: Secondary | ICD-10-CM | POA: Diagnosis not present

## 2024-05-02 DIAGNOSIS — E782 Mixed hyperlipidemia: Secondary | ICD-10-CM | POA: Diagnosis not present

## 2024-05-02 DIAGNOSIS — I480 Paroxysmal atrial fibrillation: Secondary | ICD-10-CM

## 2024-05-02 DIAGNOSIS — I5032 Chronic diastolic (congestive) heart failure: Secondary | ICD-10-CM

## 2024-05-02 NOTE — Patient Instructions (Signed)
 Medication Instructions:  Your physician recommends that you continue on your current medications as directed. Please refer to the Current Medication list given to you today.   *If you need a refill on your cardiac medications before your next appointment, please call your pharmacy*  Lab Work: No labs ordered today  If you have labs (blood work) drawn today and your tests are completely normal, you will receive your results only by: MyChart Message (if you have MyChart) OR A paper copy in the mail If you have any lab test that is abnormal or we need to change your treatment, we will call you to review the results.  Testing/Procedures: No test ordered today   Follow-Up: At [facility], you and your health needs are our priority.  As part of our continuing mission to provide you with exceptional heart care, our providers are all part of one team.  This team includes your primary Cardiologist (physician) and Advanced Practice Providers or APPs (Physician Assistants and Nurse Practitioners) who all work together to provide you with the care you need, when you need it.  Your next appointment:   6 month(s)  Provider:   Timothy Gollan, MD or Barnie Hila, NP

## 2024-05-02 NOTE — Progress Notes (Signed)
 Order(s) created erroneously. Erroneous order ID: [id number]  Order moved by: CHART CORRECTION ANALYST SEVEN, IDENTITY  Order move date/time: 05/02/2024 4:32 PM  Source Patient: [id number]  Source Contact: 05/02/2024  Destination Patient: [id number]  Destination Contact: 03/07/2023

## 2024-05-07 ENCOUNTER — Other Ambulatory Visit: Payer: Self-pay | Admitting: Internal Medicine

## 2024-05-07 DIAGNOSIS — Z1331 Encounter for screening for depression: Secondary | ICD-10-CM

## 2024-05-07 DIAGNOSIS — F17210 Nicotine dependence, cigarettes, uncomplicated: Secondary | ICD-10-CM

## 2024-05-16 ENCOUNTER — Ambulatory Visit
Admission: RE | Admit: 2024-05-16 | Discharge: 2024-05-16 | Disposition: A | Discharge status: Home / Self Care | Source: Ambulatory Visit | Attending: Internal Medicine | Admitting: Internal Medicine

## 2024-05-16 DIAGNOSIS — Z1331 Encounter for screening for depression: Secondary | ICD-10-CM | POA: Insufficient documentation

## 2024-05-16 DIAGNOSIS — F17210 Nicotine dependence, cigarettes, uncomplicated: Secondary | ICD-10-CM | POA: Diagnosis present

## 2024-05-17 ENCOUNTER — Encounter: Payer: Self-pay | Admitting: Cardiology

## 2024-05-19 ENCOUNTER — Other Ambulatory Visit: Payer: Self-pay | Admitting: Emergency Medicine

## 2024-05-19 MED ORDER — METOPROLOL SUCCINATE ER 25 MG PO TB24: 25.0000 mg | ORAL_TABLET | Freq: Every morning | ORAL | 3 refills | Status: AC

## 2024-05-19 NOTE — Progress Notes (Signed)
 Per patient request through MyChart, prescription refill order has been ordered and pending for Barnie Hila, NP to review and sign off.

## 2024-06-06 ENCOUNTER — Encounter: Payer: Self-pay | Admitting: Cardiovascular Disease

## 2024-06-11 NOTE — Telephone Encounter (Signed)
 Last read by Elveria JONELLE Pay at 7:02PM on 06/06/2024.

## 2024-07-14 ENCOUNTER — Encounter: Payer: Self-pay | Admitting: Cardiovascular Disease

## 2024-11-10 ENCOUNTER — Ambulatory Visit: Admitting: Student

## 2024-12-09 ENCOUNTER — Ambulatory Visit: Admitting: Cardiology
